# Patient Record
Sex: Male | Born: 1940 | Race: Black or African American | Hispanic: No | Smoking: Former smoker
Health system: Southern US, Community
[De-identification: ages and names within clinical notes are randomized; demographics above are authoritative.]

## PROBLEM LIST (undated history)

## (undated) DIAGNOSIS — N319 Neuromuscular dysfunction of bladder, unspecified: Secondary | ICD-10-CM

## (undated) DIAGNOSIS — N183 Chronic kidney disease, stage 3 unspecified: Secondary | ICD-10-CM

## (undated) DIAGNOSIS — J449 Chronic obstructive pulmonary disease, unspecified: Secondary | ICD-10-CM

## (undated) DIAGNOSIS — K219 Gastro-esophageal reflux disease without esophagitis: Secondary | ICD-10-CM

## (undated) DIAGNOSIS — J4489 Other specified chronic obstructive pulmonary disease: Secondary | ICD-10-CM

## (undated) DIAGNOSIS — K5730 Diverticulosis of large intestine without perforation or abscess without bleeding: Secondary | ICD-10-CM

## (undated) DIAGNOSIS — I10 Essential (primary) hypertension: Secondary | ICD-10-CM

## (undated) DIAGNOSIS — N179 Acute kidney failure, unspecified: Secondary | ICD-10-CM

## (undated) DIAGNOSIS — G8222 Paraplegia, incomplete: Secondary | ICD-10-CM

## (undated) DIAGNOSIS — F4310 Post-traumatic stress disorder, unspecified: Secondary | ICD-10-CM

## (undated) DIAGNOSIS — Z86711 Personal history of pulmonary embolism: Secondary | ICD-10-CM

## (undated) DIAGNOSIS — Z791 Long term (current) use of non-steroidal anti-inflammatories (NSAID): Secondary | ICD-10-CM

## (undated) DIAGNOSIS — I2510 Atherosclerotic heart disease of native coronary artery without angina pectoris: Secondary | ICD-10-CM

## (undated) DIAGNOSIS — J309 Allergic rhinitis, unspecified: Secondary | ICD-10-CM

## (undated) DIAGNOSIS — E7800 Pure hypercholesterolemia, unspecified: Secondary | ICD-10-CM

## (undated) DIAGNOSIS — D353 Benign neoplasm of craniopharyngeal duct: Secondary | ICD-10-CM

## (undated) DIAGNOSIS — M6281 Muscle weakness (generalized): Secondary | ICD-10-CM

## (undated) DIAGNOSIS — N186 End stage renal disease: Secondary | ICD-10-CM

## (undated) DIAGNOSIS — N400 Enlarged prostate without lower urinary tract symptoms: Secondary | ICD-10-CM

## (undated) DIAGNOSIS — Z8673 Personal history of transient ischemic attack (TIA), and cerebral infarction without residual deficits: Secondary | ICD-10-CM

## (undated) DIAGNOSIS — M5489 Other dorsalgia: Secondary | ICD-10-CM

## (undated) DIAGNOSIS — M109 Gout, unspecified: Secondary | ICD-10-CM

## (undated) DIAGNOSIS — E1122 Type 2 diabetes mellitus with diabetic chronic kidney disease: Secondary | ICD-10-CM

## (undated) DIAGNOSIS — F332 Major depressive disorder, recurrent severe without psychotic features: Secondary | ICD-10-CM

## (undated) DIAGNOSIS — E785 Hyperlipidemia, unspecified: Secondary | ICD-10-CM

## (undated) DIAGNOSIS — D352 Benign neoplasm of pituitary gland: Secondary | ICD-10-CM

## (undated) DIAGNOSIS — D509 Iron deficiency anemia, unspecified: Secondary | ICD-10-CM

## (undated) DIAGNOSIS — K5900 Constipation, unspecified: Secondary | ICD-10-CM

---

## 2016-07-14 ENCOUNTER — Emergency Department: Payer: Non-veteran care

## 2016-07-14 ENCOUNTER — Encounter: Payer: Self-pay | Admitting: Emergency Medicine

## 2016-07-14 ENCOUNTER — Inpatient Hospital Stay
Admission: EM | Admit: 2016-07-14 | Discharge: 2016-07-20 | DRG: 698 | Disposition: A | Discharge status: Skilled Nursing Facility | Payer: Non-veteran care | Attending: Internal Medicine | Admitting: Internal Medicine

## 2016-07-14 DIAGNOSIS — Z7901 Long term (current) use of anticoagulants: Secondary | ICD-10-CM | POA: Diagnosis not present

## 2016-07-14 DIAGNOSIS — N138 Other obstructive and reflux uropathy: Secondary | ICD-10-CM

## 2016-07-14 DIAGNOSIS — K219 Gastro-esophageal reflux disease without esophagitis: Secondary | ICD-10-CM | POA: Diagnosis present

## 2016-07-14 DIAGNOSIS — N186 End stage renal disease: Secondary | ICD-10-CM | POA: Diagnosis present

## 2016-07-14 DIAGNOSIS — M109 Gout, unspecified: Secondary | ICD-10-CM | POA: Diagnosis present

## 2016-07-14 DIAGNOSIS — L89612 Pressure ulcer of right heel, stage 2: Secondary | ICD-10-CM | POA: Diagnosis present

## 2016-07-14 DIAGNOSIS — E785 Hyperlipidemia, unspecified: Secondary | ICD-10-CM | POA: Diagnosis present

## 2016-07-14 DIAGNOSIS — N401 Enlarged prostate with lower urinary tract symptoms: Secondary | ICD-10-CM

## 2016-07-14 DIAGNOSIS — R5381 Other malaise: Secondary | ICD-10-CM

## 2016-07-14 DIAGNOSIS — I509 Heart failure, unspecified: Secondary | ICD-10-CM | POA: Diagnosis present

## 2016-07-14 DIAGNOSIS — T83518A Infection and inflammatory reaction due to other urinary catheter, initial encounter: Principal | ICD-10-CM | POA: Diagnosis present

## 2016-07-14 DIAGNOSIS — Z7983 Long term (current) use of bisphosphonates: Secondary | ICD-10-CM

## 2016-07-14 DIAGNOSIS — Z79899 Other long term (current) drug therapy: Secondary | ICD-10-CM

## 2016-07-14 DIAGNOSIS — G8222 Paraplegia, incomplete: Secondary | ICD-10-CM | POA: Diagnosis present

## 2016-07-14 DIAGNOSIS — E109 Type 1 diabetes mellitus without complications: Secondary | ICD-10-CM | POA: Diagnosis present

## 2016-07-14 DIAGNOSIS — Z794 Long term (current) use of insulin: Secondary | ICD-10-CM | POA: Diagnosis not present

## 2016-07-14 DIAGNOSIS — A419 Sepsis, unspecified organism: Secondary | ICD-10-CM | POA: Diagnosis present

## 2016-07-14 DIAGNOSIS — Z86711 Personal history of pulmonary embolism: Secondary | ICD-10-CM | POA: Diagnosis present

## 2016-07-14 DIAGNOSIS — Z7951 Long term (current) use of inhaled steroids: Secondary | ICD-10-CM

## 2016-07-14 DIAGNOSIS — Z8673 Personal history of transient ischemic attack (TIA), and cerebral infarction without residual deficits: Secondary | ICD-10-CM | POA: Diagnosis not present

## 2016-07-14 DIAGNOSIS — I2510 Atherosclerotic heart disease of native coronary artery without angina pectoris: Secondary | ICD-10-CM | POA: Diagnosis present

## 2016-07-14 DIAGNOSIS — I959 Hypotension, unspecified: Secondary | ICD-10-CM | POA: Diagnosis present

## 2016-07-14 DIAGNOSIS — F4310 Post-traumatic stress disorder, unspecified: Secondary | ICD-10-CM | POA: Diagnosis present

## 2016-07-14 DIAGNOSIS — N390 Urinary tract infection, site not specified: Secondary | ICD-10-CM | POA: Diagnosis present

## 2016-07-14 DIAGNOSIS — B9620 Unspecified Escherichia coli [E. coli] as the cause of diseases classified elsewhere: Secondary | ICD-10-CM | POA: Diagnosis present

## 2016-07-14 DIAGNOSIS — Y848 Other medical procedures as the cause of abnormal reaction of the patient, or of later complication, without mention of misadventure at the time of the procedure: Secondary | ICD-10-CM | POA: Diagnosis present

## 2016-07-14 DIAGNOSIS — Z7401 Bed confinement status: Secondary | ICD-10-CM

## 2016-07-14 DIAGNOSIS — E7800 Pure hypercholesterolemia, unspecified: Secondary | ICD-10-CM | POA: Diagnosis present

## 2016-07-14 DIAGNOSIS — I132 Hypertensive heart and chronic kidney disease with heart failure and with stage 5 chronic kidney disease, or end stage renal disease: Secondary | ICD-10-CM | POA: Diagnosis present

## 2016-07-14 DIAGNOSIS — F332 Major depressive disorder, recurrent severe without psychotic features: Secondary | ICD-10-CM | POA: Diagnosis present

## 2016-07-14 DIAGNOSIS — J449 Chronic obstructive pulmonary disease, unspecified: Secondary | ICD-10-CM | POA: Diagnosis present

## 2016-07-14 DIAGNOSIS — Z888 Allergy status to other drugs, medicaments and biological substances status: Secondary | ICD-10-CM

## 2016-07-14 DIAGNOSIS — R7989 Other specified abnormal findings of blood chemistry: Secondary | ICD-10-CM

## 2016-07-14 DIAGNOSIS — L89152 Pressure ulcer of sacral region, stage 2: Secondary | ICD-10-CM | POA: Diagnosis present

## 2016-07-14 DIAGNOSIS — Z91013 Allergy to seafood: Secondary | ICD-10-CM

## 2016-07-14 DIAGNOSIS — R000 Tachycardia, unspecified: Secondary | ICD-10-CM

## 2016-07-14 DIAGNOSIS — L8990 Pressure ulcer of unspecified site, unspecified stage: Secondary | ICD-10-CM | POA: Insufficient documentation

## 2016-07-14 HISTORY — DX: Muscle weakness (generalized): M62.81

## 2016-07-14 HISTORY — DX: Long term (current) use of non-steroidal anti-inflammatories (nsaid): Z79.1

## 2016-07-14 HISTORY — DX: Post-traumatic stress disorder, unspecified: F43.10

## 2016-07-14 HISTORY — DX: Other dorsalgia: M54.89

## 2016-07-14 HISTORY — DX: Iron deficiency anemia, unspecified: D50.9

## 2016-07-14 HISTORY — DX: Allergic rhinitis, unspecified: J30.9

## 2016-07-14 HISTORY — DX: Constipation, unspecified: K59.00

## 2016-07-14 HISTORY — DX: Other specified chronic obstructive pulmonary disease: J44.89

## 2016-07-14 HISTORY — DX: Type 2 diabetes mellitus with diabetic chronic kidney disease: E11.22

## 2016-07-14 HISTORY — DX: Gastro-esophageal reflux disease without esophagitis: K21.9

## 2016-07-14 HISTORY — DX: Personal history of transient ischemic attack (TIA), and cerebral infarction without residual deficits: Z86.73

## 2016-07-14 HISTORY — DX: Chronic obstructive pulmonary disease, unspecified: J44.9

## 2016-07-14 HISTORY — DX: Pure hypercholesterolemia, unspecified: E78.00

## 2016-07-14 HISTORY — DX: End stage renal disease: N18.6

## 2016-07-14 HISTORY — DX: Benign prostatic hyperplasia without lower urinary tract symptoms: N40.0

## 2016-07-14 HISTORY — DX: Essential (primary) hypertension: I10

## 2016-07-14 HISTORY — DX: Hyperlipidemia, unspecified: E78.5

## 2016-07-14 HISTORY — DX: Benign neoplasm of craniopharyngeal duct: D35.3

## 2016-07-14 HISTORY — DX: Acute kidney failure, unspecified: N17.9

## 2016-07-14 HISTORY — DX: Gout, unspecified: M10.9

## 2016-07-14 HISTORY — DX: Atherosclerotic heart disease of native coronary artery without angina pectoris: I25.10

## 2016-07-14 HISTORY — DX: Benign neoplasm of craniopharyngeal duct: D35.2

## 2016-07-14 HISTORY — DX: Diverticulosis of large intestine without perforation or abscess without bleeding: K57.30

## 2016-07-14 HISTORY — DX: Neuromuscular dysfunction of bladder, unspecified: N31.9

## 2016-07-14 HISTORY — DX: Chronic kidney disease, stage 3 (moderate): N18.3

## 2016-07-14 HISTORY — DX: Chronic kidney disease, stage 3 unspecified: N18.30

## 2016-07-14 HISTORY — DX: Personal history of pulmonary embolism: Z86.711

## 2016-07-14 HISTORY — DX: Paraplegia, incomplete: G82.22

## 2016-07-14 HISTORY — DX: Major depressive disorder, recurrent severe without psychotic features: F33.2

## 2016-07-14 LAB — COMPREHENSIVE METABOLIC PANEL
ALT: 24 U/L (ref 17–63)
AST: 34 U/L (ref 15–41)
Albumin: 3.3 g/dL — ABNORMAL LOW (ref 3.5–5.0)
Alkaline Phosphatase: 132 U/L — ABNORMAL HIGH (ref 38–126)
Anion gap: 9 (ref 5–15)
BUN: 51 mg/dL — ABNORMAL HIGH (ref 6–20)
CHLORIDE: 99 mmol/L — AB (ref 101–111)
CO2: 28 mmol/L (ref 22–32)
CREATININE: 1.94 mg/dL — AB (ref 0.61–1.24)
Calcium: 9.2 mg/dL (ref 8.9–10.3)
GFR, EST AFRICAN AMERICAN: 37 mL/min — AB (ref >60)
GFR, EST NON AFRICAN AMERICAN: 32 mL/min — AB (ref >60)
Glucose, Bld: 176 mg/dL — ABNORMAL HIGH (ref 65–99)
POTASSIUM: 3.6 mmol/L (ref 3.5–5.1)
Sodium: 136 mmol/L (ref 135–145)
Total Bilirubin: 0.7 mg/dL (ref 0.3–1.2)
Total Protein: 7.5 g/dL (ref 6.5–8.1)

## 2016-07-14 LAB — BLOOD GAS, VENOUS
BICARBONATE: 29.8 mmol/L — AB (ref 20.0–28.0)
PATIENT TEMPERATURE: 37
PCO2 VEN: 54 mmHg (ref 44.0–60.0)
pH, Ven: 7.35 (ref 7.250–7.430)
pO2, Ven: <31 mmHg — CL (ref 32.0–45.0)

## 2016-07-14 LAB — CBC WITH DIFFERENTIAL/PLATELET
Basophils Absolute: 0.1 10*3/uL (ref 0–0.1)
Basophils Relative: 1 %
Eosinophils Absolute: 0.1 10*3/uL (ref 0–0.7)
Eosinophils Relative: 1 %
HEMATOCRIT: 39.3 % — AB (ref 40.0–52.0)
HEMOGLOBIN: 13.3 g/dL (ref 13.0–18.0)
LYMPHS ABS: 1.9 10*3/uL (ref 1.0–3.6)
LYMPHS PCT: 20 %
MCH: 31.5 pg (ref 26.0–34.0)
MCHC: 33.8 g/dL (ref 32.0–36.0)
MCV: 93.2 fL (ref 80.0–100.0)
Monocytes Absolute: 1 10*3/uL (ref 0.2–1.0)
Monocytes Relative: 10 %
NEUTROS PCT: 68 %
Neutro Abs: 6.7 10*3/uL — ABNORMAL HIGH (ref 1.4–6.5)
Platelets: 279 10*3/uL (ref 150–440)
RBC: 4.21 MIL/uL — AB (ref 4.40–5.90)
RDW: 18.1 % — ABNORMAL HIGH (ref 11.5–14.5)
WBC: 9.8 10*3/uL (ref 3.8–10.6)

## 2016-07-14 LAB — URINALYSIS, COMPLETE (UACMP) WITH MICROSCOPIC
BILIRUBIN URINE: NEGATIVE
Glucose, UA: NEGATIVE mg/dL
Ketones, ur: NEGATIVE mg/dL
NITRITE: NEGATIVE
PH: 5 (ref 5.0–8.0)
Protein, ur: 100 mg/dL — AB
SPECIFIC GRAVITY, URINE: 1.012 (ref 1.005–1.030)
Squamous Epithelial / LPF: NONE SEEN

## 2016-07-14 LAB — LACTIC ACID, PLASMA
Lactic Acid, Venous: 2.1 mmol/L (ref 0.5–1.9)
Lactic Acid, Venous: 1.8 mmol/L (ref 0.5–1.9)

## 2016-07-14 LAB — PROTIME-INR
INR: 1.92
Prothrombin Time: 22.2 seconds — ABNORMAL HIGH (ref 11.4–15.2)

## 2016-07-14 MED ORDER — SENNOSIDES-DOCUSATE SODIUM 8.6-50 MG PO TABS
1.0000 | ORAL_TABLET | Freq: Every day | ORAL | Status: DC
  Administered 2016-07-15 – 2016-07-19 (×5): 1 via ORAL
  Filled 2016-07-14 (×6): qty 1

## 2016-07-14 MED ORDER — PIPERACILLIN-TAZOBACTAM 3.375 G IVPB 30 MIN
3.3750 g | Freq: Three times a day (TID) | INTRAVENOUS | Status: DC
  Filled 2016-07-14 (×2): qty 50

## 2016-07-14 MED ORDER — SODIUM CHLORIDE 0.9 % IV BOLUS (SEPSIS)
1000.0000 mL | Freq: Once | INTRAVENOUS | Status: AC
  Administered 2016-07-14: 1000 mL via INTRAVENOUS

## 2016-07-14 MED ORDER — ACETAMINOPHEN 325 MG PO TABS: 650.0000 mg | ORAL_TABLET | Freq: Four times a day (QID) | ORAL | Status: DC | PRN

## 2016-07-14 MED ORDER — MOMETASONE FURO-FORMOTEROL FUM 200-5 MCG/ACT IN AERO
2.0000 | INHALATION_SPRAY | Freq: Two times a day (BID) | RESPIRATORY_TRACT | Status: DC
  Administered 2016-07-15 – 2016-07-20 (×11): 2 via RESPIRATORY_TRACT
  Filled 2016-07-14: qty 8.8

## 2016-07-14 MED ORDER — WARFARIN SODIUM 1 MG PO TABS: 2.0000 mg | ORAL_TABLET | ORAL | Status: DC

## 2016-07-14 MED ORDER — VANCOMYCIN HCL IN DEXTROSE 1-5 GM/200ML-% IV SOLN
1000.0000 mg | Freq: Once | INTRAVENOUS | Status: AC
  Administered 2016-07-14: 1000 mg via INTRAVENOUS
  Filled 2016-07-14: qty 200

## 2016-07-14 MED ORDER — INSULIN GLARGINE 100 UNIT/ML ~~LOC~~ SOLN
15.0000 [IU] | Freq: Every morning | SUBCUTANEOUS | Status: DC
  Administered 2016-07-15 – 2016-07-20 (×6): 15 [IU] via SUBCUTANEOUS
  Filled 2016-07-14 (×6): qty 0.15

## 2016-07-14 MED ORDER — INSULIN ASPART 100 UNIT/ML ~~LOC~~ SOLN: 0.0000 [IU] | Freq: Three times a day (TID) | SUBCUTANEOUS | Status: DC

## 2016-07-14 MED ORDER — TAMSULOSIN HCL 0.4 MG PO CAPS
0.4000 mg | ORAL_CAPSULE | Freq: Every day | ORAL | Status: DC
  Administered 2016-07-15 – 2016-07-17 (×3): 0.4 mg via ORAL
  Filled 2016-07-14 (×3): qty 1

## 2016-07-14 MED ORDER — WARFARIN SODIUM 1 MG PO TABS
4.0000 mg | ORAL_TABLET | ORAL | Status: DC
  Administered 2016-07-16: 18:00:00 4 mg via ORAL
  Filled 2016-07-14: qty 4

## 2016-07-14 MED ORDER — ACETAMINOPHEN 650 MG RE SUPP: 650.0000 mg | Freq: Four times a day (QID) | RECTAL | Status: DC | PRN

## 2016-07-14 MED ORDER — HEPARIN SODIUM (PORCINE) 5000 UNIT/ML IJ SOLN
5000.0000 [IU] | Freq: Three times a day (TID) | INTRAMUSCULAR | Status: DC
  Administered 2016-07-15 (×2): 5000 [IU] via SUBCUTANEOUS
  Filled 2016-07-14 (×2): qty 1

## 2016-07-14 MED ORDER — PIPERACILLIN-TAZOBACTAM 3.375 G IVPB 30 MIN
3.3750 g | Freq: Once | INTRAVENOUS | Status: AC
Start: 2016-07-14 — End: 2016-07-14
  Administered 2016-07-14: 3.375 g via INTRAVENOUS
  Filled 2016-07-14: qty 50

## 2016-07-14 MED ORDER — METOPROLOL SUCCINATE ER 50 MG PO TB24: 100.0000 mg | ORAL_TABLET | Freq: Every day | ORAL | Status: DC

## 2016-07-14 NOTE — ED Provider Notes (Signed)
[facility] Emergency Department Provider Note    ____________________________________________   I have reviewed the triage vital signs and the nursing notes.   HISTORY  Chief Complaint Code Sepsis; Altered Mental Status; Hypotension; and Tachycardia   History limited by: Not Limited   HPI Carlos Calderon is a 76 y.o. male who presents to the emergency department today from living facility because of concerns for low blood pressure and decreased level of consciousness. Patient states he's been feeling unwell since yesterday. States it started certainly after he ate. It has continued today. He has had some associated shortness of breath and cough. He denies any associated chest or abdominal pain.    No past medical history on file.  There are no active problems to display for this patient.   No past surgical history on file.  Prior to Admission medications   Not on File    Allergies Patient has no allergy information on record.  No family history on file.  Social History Social History  Substance Use Topics  . Smoking status: Not on file  . Smokeless tobacco: Not on file  . Alcohol use Not on file  Lives at [facility]  Constitutional: Negative for fever. Cardiovascular: Negative for chest pain. Respiratory: Positive for shortness of breath. Gastrointestinal: Negative for abdominal pain, vomiting and diarrhea. Genitourinary: Negative for dysuria. Musculoskeletal: Negative for back pain. Skin: Negative for rash. Neurological: Negative for headaches, focal weakness or numbness.  10-point ROS otherwise negative.  ____________________________________________   PHYSICAL EXAM:  VITAL SIGNS: ED Triage Vitals  Enc Vitals Group     BP 07/14/16 1800 91/79     Pulse Rate 07/14/16 1800 (!) 122     Resp 07/14/16 1800 (!) 27     Temp 07/14/16 1800 97.3 F (36.3 C)     Temp Source 07/14/16 1800 Oral     SpO2 07/14/16  1800 93 %     Weight 07/14/16 1801 274 lb 11.2 oz (124.6 kg)   Constitutional: Alert and oriented. Well appearing and in no distress. Eyes: Conjunctivae are normal. Normal extraocular movements. ENT   Head: Normocephalic and atraumatic.   Nose: No congestion/rhinnorhea.   Mouth/Throat: Mucous membranes are moist.   Neck: No stridor. Hematological/Lymphatic/Immunilogical: No cervical lymphadenopathy. Cardiovascular:Tachycardic, regular rhythm.  No murmurs, rubs, or gallops. Respiratory: Tachypnea. Breath sounds are clear and equal bilaterally. No wheezes/rales/rhonchi. Gastrointestinal: Soft and non tender. No rebound. No guarding.  Genitourinary: Deferred Musculoskeletal: Normal range of motion in all extremities. No lower extremity edema. Neurologic:  Normal speech and language. No gross focal neurologic deficits are appreciated.  Skin:  Skin is warm, dry and intact. No rash noted. Psychiatric: Mood and affect are normal. Speech and behavior are normal. Patient exhibits appropriate insight and judgment.  ____________________________________________    LABS (pertinent positives/negatives)  Labs Reviewed  COMPREHENSIVE METABOLIC PANEL - Abnormal; Notable for the following:       Result Value   Chloride 99 (*)    Glucose, Bld 176 (*)    BUN 51 (*)    Creatinine, Ser 1.94 (*)    Albumin 3.3 (*)    Alkaline Phosphatase 132 (*)    GFR calc non Af Amer 32 (*)    GFR calc Af Amer 37 (*)    All other components within normal limits  LACTIC ACID, PLASMA - Abnormal; Notable for the following:    Lactic Acid, Venous 2.1 (*)    All other components within normal  limits  CBC WITH DIFFERENTIAL/PLATELET - Abnormal; Notable for the following:    RBC 4.21 (*)    HCT 39.3 (*)    RDW 18.1 (*)    Neutro Abs 6.7 (*)    All other components within normal limits  PROTIME-INR - Abnormal; Notable for the following:    Prothrombin Time 22.2 (*)    All other components within normal  limits  URINALYSIS, COMPLETE (UACMP) WITH MICROSCOPIC - Abnormal; Notable for the following:    Color, Urine YELLOW (*)    APPearance CLOUDY (*)    Hgb urine dipstick MODERATE (*)    Protein, ur 100 (*)    Leukocytes, UA LARGE (*)    Bacteria, UA MANY (*)    All other components within normal limits  BLOOD GAS, VENOUS - Abnormal; Notable for the following:    pO2, Ven <31.0 (*)    Bicarbonate 29.8 (*)    All other components within normal limits  CULTURE, BLOOD (ROUTINE X 2)  CULTURE, BLOOD (ROUTINE X 2)  URINE CULTURE  LACTIC ACID, PLASMA     ____________________________________________   EKG  I, Nance Pear, attending physician, personally viewed and interpreted this EKG  EKG Time: 1812 Rate: 120 Rhythm: sinus tachycardia Axis: left axis deviation Intervals: qtc 515 QRS: RBBB, LAFB ST changes: no st elevation Impression: abnormal ekg   ____________________________________________    RADIOLOGY  CXR IMPRESSION:  Mild cardiomegaly with possible mild vascular congestion. No focal  consolidation.    ____________________________________________   PROCEDURES  Procedures  CRITICAL CARE Performed by: Nance Pear   Total critical care time: 35 minutes  Critical care time was exclusive of separately billable procedures and treating other patients.  Critical care was necessary to treat or prevent imminent or life-threatening deterioration.  Critical care was time spent personally by me on the following activities: development of treatment plan with patient and/or surrogate as well as nursing, discussions with consultants, evaluation of patient's response to treatment, examination of patient, obtaining history from patient or surrogate, ordering and performing treatments and interventions, ordering and review of laboratory studies, ordering and review of radiographic studies, pulse oximetry and re-evaluation of patient's  condition.  ____________________________________________   INITIAL IMPRESSION / ASSESSMENT AND PLAN / ED COURSE  Pertinent labs & imaging results that were available during my care of the patient were reviewed by me and considered in my medical decision making (see chart for details).  Patient presented to the emergency department today because of concerns for feeling unwell. States symptoms started yesterday. On exam patient is awake and alert. He was significantly tachycardic. Additionally patient was tachypneic. Given these concerns patient was called a code sepsis. He was given multiple broad-spectrum IV antibiotics. Urine is likely the source at this time. Patient's chest x-ray without any obvious pneumonia. No leukocytosis although patient did have mild elevation of the lactic acid. Patient will be admitted to the hospitalist service.  ____________________________________________   FINAL CLINICAL IMPRESSION(S) / ED DIAGNOSES  Final diagnoses:  Elevated lactic acid level  Malaise  Tachycardia     Note: This dictation was prepared with Dragon dictation. Any transcriptional errors that result from this process are unintentional     Nance Pear, MD 07/14/16 2009

## 2016-07-14 NOTE — ED Notes (Signed)
Pt eating meal tray 

## 2016-07-14 NOTE — ED Notes (Signed)
Pt BP noted to be 70/40. Pt placed in mild trendelenburg at this time. MD notified, pt's BP 91/48.

## 2016-07-14 NOTE — H&P (Addendum)
PCP:   Alvester Morin, MD   Chief Complaint:  Not feeling well  HPI: This is a 76y/o male who resides at the nursing home who states he has not been feeling well the past 2 days. He reports nausea, diarrhea and upset stomach. He denies vomiting but states he did retch. He denies any BOU but he has a foley in place. He reports chills,and fevers. He denies any altered mentation. He denies any evidence of blood in his stool or urine.   Review of Systems:  The patient denies anorexia, fever, weight loss,, vision loss, decreased hearing, hoarseness, chest pain, syncope, dyspnea on exertion, peripheral edema, balance deficits, hemoptysis, nausea and vomiting, abdominal pain, melena, hematochezia, severe indigestion/heartburn, hematuria, incontinence, genital sores, muscle weakness, suspicious skin lesions, transient blindness, difficulty walking, depression, unusual weight change, abnormal bleeding, enlarged lymph nodes, angioedema, and breast masses.  Past Medical History: Past Medical History:  Diagnosis Date  . Acute renal failure syndrome ([place])   . Atopic rhinitis   . Benign enlargement of prostate   . 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 Chronic kidney disease, stage III (moderate)   . CN (constipation)   . Coronary atherosclerosis of native coronary artery   . Diverticulosis of large intestine without diverticulitis   . Encounter for long-term (current) use of non-steroidal anti-inflammatories   . Esophageal reflux   . Essential hypertension, malignant   . Gout, joint   . Hyperlipemia   . Incomplete paraplegia ([place])   . Left paraspinal back pain   . Muscle weakness (generalized)   . Neurogenic dysfunction of the urinary bladder   . Normocytic hypochromic anemia   . Obstructive chronic bronchitis without exacerbation ([place])   . Personal history of PE (pulmonary embolism)   . Personal history of transient cerebral ischemia   .  Posttraumatic stress disorder   . Pure hypercholesterolemia   . Severe recurrent major depression without psychotic features ([place])   . 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 No pertinent surgical history.  Medications: Prior to Admission medications   Medication Sig Start Date End Date Taking? Authorizing Provider  acetaminophen (TYLENOL) 325 MG tablet Take 650 mg by mouth every 4 (four) hours as needed.   Yes Historical Provider, MD  atorvastatin (LIPITOR) 40 MG tablet Take 40 mg by mouth at bedtime.   Yes Historical Provider, MD  budesonide-formoterol (SYMBICORT) 160-4.5 MCG/ACT inhaler Inhale 2 puffs into the lungs 2 (two) times daily.   Yes Historical Provider, MD  Cranberry 450 MG CAPS Take 1 capsule by mouth daily.   Yes Historical Provider, MD  insulin glargine (LANTUS) 100 UNIT/ML injection Inject 15 Units into the skin every morning.   Yes Historical Provider, MD  metoprolol succinate (TOPROL-XL) 100 MG 24 hr tablet Take 100 mg by mouth daily. Take with or immediately following a meal.   Yes Historical Provider, MD  Multiple Vitamin (MULTIVITAMIN) tablet Take 1 tablet by mouth daily.   Yes Historical Provider, MD  nicotine (NICODERM CQ - DOSED IN MG/24 HOURS) 14 mg/24hr patch Place 14 mg onto the skin daily.   Yes Historical Provider, MD  sennosides-docusate sodium (SENOKOT-S) 8.6-50 MG tablet Take 1 tablet by mouth daily.   Yes Historical Provider, MD  tamsulosin (FLOMAX) 0.4 MG CAPS capsule Take 0.4 mg by mouth daily.   Yes Historical Provider, MD  torsemide (DEMADEX) 20 MG tablet Take 40 mg by mouth daily.  Yes Historical Provider, MD  umeclidinium bromide (INCRUSE ELLIPTA) 62.5 MCG/INH AEPB Inhale 1 puff into the lungs daily.   Yes Historical Provider, MD  urea (CARMOL) 20 % cream Apply 1 application topically 2 (two) times daily.   Yes Historical Provider, MD  warfarin (COUMADIN) 2 MG tablet Take 2 mg by mouth See admin instructions.  Tuesday, Thursday, Saturday, and Sunday   Yes Historical Provider, MD  warfarin (COUMADIN) 4 MG tablet Take 4 mg by mouth See admin instructions. Monday, Wednesday, and Friday   Yes Historical Provider, MD    Allergies:   Allergies  Allergen Reactions  . Allopurinol   . Hctz [Hydrochlorothiazide]   . Oysters [employer] Allergy]     Social History:  negative for tobacco, alcohol or illicit drugs  Family History: History reviewed. No pertinent family history.  Physical Exam: Vitals:   07/14/16 1921 07/14/16 2030 07/14/16 2046 07/14/16 2140  BP: (!) 86/69 107/84 114/86 112/87  Pulse: (!) 115   (!) 108  Resp: 19 (!) 23 (!) 23 18  Temp:      TempSrc:      SpO2: 100%  100% 100%  Weight:        General:  Alert and oriented times three, well developed and nourished, no acute distress Eyes: PERRLA, pink conjunctiva, no scleral icterus ENT: dry oral mucosa, neck supple, no thyromegaly Lungs: clear to ascultation, no wheeze, no crackles, no use of accessory muscles Cardiovascular: regular rate and rhythm, no regurgitation, no gallops, no murmurs. No carotid bruits, no JVD Abdomen: soft, positive BS, non-tender, non-distended, no organomegaly, not an acute abdomen GU: chronic indwelling foley Neuro: CN II - XII grossly intact, sensation intact Musculoskeletal: strength 1/5 B/L LE,  Skin: thick black skin on B/L LE chronic,  Psych: appropriate patient   Labs on Admission:   Recent Labs  07/14/16 1810  NA 136  K 3.6  CL 99*  CO2 28  GLUCOSE 176*  BUN 51*  CREATININE 1.94*  CALCIUM 9.2    Recent Labs  07/14/16 1810  AST 34  ALT 24  ALKPHOS 132*  BILITOT 0.7  PROT 7.5  ALBUMIN 3.3*   No results for input(s): LIPASE, AMYLASE in the last 72 hours.  Recent Labs  07/14/16 1810  WBC 9.8  NEUTROABS 6.7*  HGB 13.3  HCT 39.3*  MCV 93.2  PLT 279   No results for input(s): CKTOTAL, CKMB, CKMBINDEX, TROPONINI in the last 72 hours. Invalid input(s): POCBNP No  results for input(s): DDIMER in the last 72 hours. No results for input(s): HGBA1C in the last 72 hours. No results for input(s): CHOL, HDL, LDLCALC, TRIG, CHOLHDL, LDLDIRECT in the last 72 hours. No results for input(s): TSH, T4TOTAL, T3FREE, THYROIDAB in the last 72 hours.  Invalid input(s): FREET3 No results for input(s): VITAMINB12, FOLATE, FERRITIN, TIBC, IRON, RETICCTPCT in the last 72 hours.  Micro Results: No results found for this or any previous visit (from the past 240 hour(s)).   Radiological Exams on Admission: Dg Chest Portable 1 View  Result Date: 07/14/2016 CLINICAL DATA:  76 year old male with sepsis EXAM: PORTABLE CHEST 1 VIEW COMPARISON:  None. FINDINGS: There is a shallow inspiration with bibasilar atelectatic changes, left greater right. There is no focal consolidation, pleural effusion, or pneumothorax. There is mild cardiomegaly with mild central vascular prominence which may either mild vascular congestion. No acute osseous pathology identified. IMPRESSION: Mild cardiomegaly with possible mild vascular congestion. No focal consolidation. Electronically Signed   By: Anner Crete  M.D.   On: 07/14/2016 19:01    Assessment/Plan Present on Admission: . Sepsis secondary to UTI [facility]) -admit to medsurg -blood and urine cultures collected -IV antibiotics vanco and zosyn ordered  . Hypotension -likely secondary top above -gentle IVF as patient with some fluid overload  chf -echo in AM. No prior studies  . Type 2 diabetes mellitus without complication (HCC) -ADa diet, SSI -home meds resumed  . chronic anticoaggulation h/o PE -stable, home meds reasumed -pharmacy to monitor   BPH -patient unable to confirm if he has BPH but patient with chronic foley. Foley changed in ER  Paraplegia h/o CVA -aware, home meds resumed  Chronic kidney disease -baseline unknown, no prior labs, IVF hydration undereway. Repeat BMP in AM    Larayne Baxley 07/14/2016, 9:47  PM

## 2016-07-14 NOTE — Progress Notes (Signed)
Delay in reporting critical venous po2.Spoke with Dr Archie Balboa @ [phone]

## 2016-07-14 NOTE — ED Triage Notes (Signed)
Patient from [facility]. Family requested patient be sent to [facility] for evaluation of Hypotension. Patient arrives, altered, hypotensive, tachycardic, tachypnec

## 2016-07-14 NOTE — ED Notes (Signed)
Pt status set to admit for the last hour, called ED sec to check on status

## 2016-07-15 ENCOUNTER — Inpatient Hospital Stay
Admit: 2016-07-15 | Discharge: 2016-07-15 | Disposition: A | Discharge status: Home / Self Care | Payer: Non-veteran care | Attending: Family Medicine | Admitting: Family Medicine

## 2016-07-15 LAB — ECHOCARDIOGRAM COMPLETE
HEIGHTINCHES: 76.8 in
Weight: 4281.6 oz

## 2016-07-15 LAB — BLOOD CULTURE ID PANEL (REFLEXED)
ACINETOBACTER BAUMANNII: NOT DETECTED
CANDIDA KRUSEI: NOT DETECTED
CANDIDA PARAPSILOSIS: NOT DETECTED
Candida albicans: NOT DETECTED
Candida glabrata: NOT DETECTED
Candida tropicalis: NOT DETECTED
ESCHERICHIA COLI: NOT DETECTED
Enterobacter cloacae complex: NOT DETECTED
Enterobacteriaceae species: NOT DETECTED
Enterococcus species: NOT DETECTED
HAEMOPHILUS INFLUENZAE: NOT DETECTED
KLEBSIELLA OXYTOCA: NOT DETECTED
Klebsiella pneumoniae: NOT DETECTED
LISTERIA MONOCYTOGENES: NOT DETECTED
METHICILLIN RESISTANCE: DETECTED — AB
Neisseria meningitidis: NOT DETECTED
PROTEUS SPECIES: NOT DETECTED
Pseudomonas aeruginosa: NOT DETECTED
SERRATIA MARCESCENS: NOT DETECTED
STAPHYLOCOCCUS AUREUS BCID: NOT DETECTED
STAPHYLOCOCCUS SPECIES: DETECTED — AB
STREPTOCOCCUS PNEUMONIAE: NOT DETECTED
Streptococcus agalactiae: NOT DETECTED
Streptococcus pyogenes: NOT DETECTED
Streptococcus species: NOT DETECTED

## 2016-07-15 LAB — BASIC METABOLIC PANEL
Anion gap: 7 (ref 5–15)
BUN: 50 mg/dL — AB (ref 6–20)
CHLORIDE: 104 mmol/L (ref 101–111)
CO2: 27 mmol/L (ref 22–32)
CREATININE: 1.84 mg/dL — AB (ref 0.61–1.24)
Calcium: 8.7 mg/dL — ABNORMAL LOW (ref 8.9–10.3)
GFR calc Af Amer: 40 mL/min — ABNORMAL LOW (ref >60)
GFR calc non Af Amer: 34 mL/min — ABNORMAL LOW (ref >60)
Glucose, Bld: 150 mg/dL — ABNORMAL HIGH (ref 65–99)
Potassium: 3.6 mmol/L (ref 3.5–5.1)
SODIUM: 138 mmol/L (ref 135–145)

## 2016-07-15 LAB — CREATININE, SERUM
Creatinine, Ser: 1.94 mg/dL — ABNORMAL HIGH (ref 0.61–1.24)
GFR calc Af Amer: 37 mL/min — ABNORMAL LOW (ref >60)
GFR calc non Af Amer: 32 mL/min — ABNORMAL LOW (ref >60)

## 2016-07-15 LAB — CBC
HCT: 34.4 % — ABNORMAL LOW (ref 40.0–52.0)
Hemoglobin: 11.6 g/dL — ABNORMAL LOW (ref 13.0–18.0)
MCH: 31.5 pg (ref 26.0–34.0)
MCHC: 33.7 g/dL (ref 32.0–36.0)
MCV: 93.3 fL (ref 80.0–100.0)
PLATELETS: 216 10*3/uL (ref 150–440)
RBC: 3.69 MIL/uL — ABNORMAL LOW (ref 4.40–5.90)
RDW: 18.2 % — ABNORMAL HIGH (ref 11.5–14.5)
WBC: 7.6 10*3/uL (ref 3.8–10.6)

## 2016-07-15 LAB — VANCOMYCIN, RANDOM: Vancomycin Rm: 23

## 2016-07-15 LAB — MRSA PCR SCREENING: MRSA BY PCR: NEGATIVE

## 2016-07-15 LAB — GLUCOSE, CAPILLARY
GLUCOSE-CAPILLARY: 111 mg/dL — AB (ref 65–99)
Glucose-Capillary: 105 mg/dL — ABNORMAL HIGH (ref 65–99)
Glucose-Capillary: 120 mg/dL — ABNORMAL HIGH (ref 65–99)
Glucose-Capillary: 153 mg/dL — ABNORMAL HIGH (ref 65–99)
Glucose-Capillary: 201 mg/dL — ABNORMAL HIGH (ref 65–99)

## 2016-07-15 LAB — PROTIME-INR
INR: 2.14
PROTHROMBIN TIME: 24.3 s — AB (ref 11.4–15.2)

## 2016-07-15 MED ORDER — HYDROCERIN EX CREA
TOPICAL_CREAM | Freq: Two times a day (BID) | CUTANEOUS | Status: DC
  Administered 2016-07-15 – 2016-07-20 (×8): via TOPICAL
  Filled 2016-07-15: qty 113

## 2016-07-15 MED ORDER — VANCOMYCIN HCL 10 G IV SOLR
1500.0000 mg | INTRAVENOUS | Status: DC
  Administered 2016-07-15: 1500 mg via INTRAVENOUS
  Filled 2016-07-15 (×2): qty 1500

## 2016-07-15 MED ORDER — INSULIN ASPART 100 UNIT/ML ~~LOC~~ SOLN
0.0000 [IU] | Freq: Three times a day (TID) | SUBCUTANEOUS | Status: DC
  Administered 2016-07-15 – 2016-07-17 (×5): 2 [IU] via SUBCUTANEOUS
  Administered 2016-07-18 – 2016-07-20 (×4): 1 [IU] via SUBCUTANEOUS
  Filled 2016-07-15: qty 1
  Filled 2016-07-15 (×2): qty 2
  Filled 2016-07-15 (×2): qty 1
  Filled 2016-07-15 (×3): qty 2
  Filled 2016-07-15: qty 1

## 2016-07-15 MED ORDER — WARFARIN - PHYSICIAN DOSING INPATIENT: Freq: Every day | Status: DC

## 2016-07-15 MED ORDER — WARFARIN - PHARMACIST DOSING INPATIENT
Freq: Every day | Status: DC
  Administered 2016-07-15 – 2016-07-18 (×3)
  Administered 2016-07-19: 18:00:00 4

## 2016-07-15 MED ORDER — VANCOMYCIN HCL IN DEXTROSE 1-5 GM/200ML-% IV SOLN
1000.0000 mg | Freq: Once | INTRAVENOUS | Status: AC
  Administered 2016-07-15: 02:00:00 1000 mg via INTRAVENOUS
  Filled 2016-07-15: qty 200

## 2016-07-15 MED ORDER — WARFARIN SODIUM 1 MG PO TABS
2.0000 mg | ORAL_TABLET | ORAL | Status: DC
  Administered 2016-07-15: 18:00:00 2 mg via ORAL
  Filled 2016-07-15: qty 2

## 2016-07-15 MED ORDER — INSULIN ASPART 100 UNIT/ML ~~LOC~~ SOLN: 0.0000 [IU] | Freq: Every day | SUBCUTANEOUS | Status: DC

## 2016-07-15 MED ORDER — PIPERACILLIN-TAZOBACTAM 3.375 G IVPB
3.3750 g | Freq: Three times a day (TID) | INTRAVENOUS | Status: DC
  Administered 2016-07-15 – 2016-07-18 (×10): 3.375 g via INTRAVENOUS
  Filled 2016-07-15 (×10): qty 50

## 2016-07-15 NOTE — Progress Notes (Signed)
ANTICOAGULATION CONSULT NOTE - Initial Consult  Pharmacy Consult for warfarin dosing Indication: h/o pulmonary embolus  Allergies  Allergen Reactions  . Allopurinol   . Hctz [Hydrochlorothiazide]   . Oysters [facility] Allergy]     Patient Measurements: Height: 6' 4.8" (195.1 cm) Weight: 267 lb 9.6 oz (121.4 kg) IBW/kg (Calculated) : 88.64 Heparin Dosing Weight: 114 kg   Vital Signs: Temp: 97.8 F (36.6 C) (03/18 0019) Temp Source: Oral (03/18 0019) BP: 98/64 (03/18 0019) Pulse Rate: 108 (03/18 0019)  Labs:  Recent Labs  07/14/16 1810  HGB 13.3  HCT 39.3*  PLT 279  LABPROT 22.2*  INR 1.92  CREATININE 1.94*    Estimated Creatinine Clearance: 48.1 mL/min (A) (by C-G formula based on SCr of 1.94 mg/dL (H)).   Medical History: Past Medical History:  Diagnosis Date  . Acute renal failure syndrome ([place])   . Atopic rhinitis   . Benign enlargement of prostate   . 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 Chronic kidney disease, stage III (moderate)   . CN (constipation)   . Coronary atherosclerosis of native coronary artery   . Diverticulosis of large intestine without diverticulitis   . Encounter for long-term (current) use of non-steroidal anti-inflammatories   . Esophageal reflux   . Essential hypertension, malignant   . Gout, joint   . Hyperlipemia   . Incomplete paraplegia ([place])   . Left paraspinal back pain   . Muscle weakness (generalized)   . Neurogenic dysfunction of the urinary bladder   . Normocytic hypochromic anemia   . Obstructive chronic bronchitis without exacerbation ([place])   . Personal history of PE (pulmonary embolism)   . Personal history of transient cerebral ischemia   . Posttraumatic stress disorder   . Pure hypercholesterolemia   . Severe recurrent major depression without psychotic features ([place])   . Type 2 diabetes mellitus with end-stage renal disease (HCC)     Medications:  Scheduled:  .  heparin  5,000 Units Subcutaneous Q8H  . insulin aspart  0-15 Units Subcutaneous TID WC  . insulin glargine  15 Units Subcutaneous q morning - 10a  . metoprolol succinate  100 mg Oral Daily  . mometasone-formoterol  2 puff Inhalation BID  . piperacillin-tazobactam  3.375 g Intravenous Q8H  . senna-docusate  1 tablet Oral Daily  . tamsulosin  0.4 mg Oral Daily  . vancomycin  1,000 mg Intravenous Once  . warfarin  2 mg Oral Q T,Th,S,Su-1800  . [START ON 07/16/2016] warfarin  4 mg Oral Q M,W,F-1800  . Warfarin - Physician Dosing Inpatient   Does not apply q1800    Assessment: Patient admitted for N/V and is anticoagulated w/ warfarin for h/o of PE. Patient takes warfarin: Warfarin 4 mg on MWF Warfarin 2 mg on TThSaSun  3/17 INR 1.92 Goal of Therapy:  INR 2-3 Monitor platelets by anticoagulation protocol: Yes   Plan:  Will continue home dose of warfarin 2 mg on TThSaSun and 4 mg on MWF. Will follow daily INRs while in-house and continue to monitor CBC  Thank you for this consult.  Tobie Lords, PharmD, BCPS Clinical Pharmacist 07/15/2016

## 2016-07-15 NOTE — Progress Notes (Signed)
A&O pt, disoriented to year. Denies pain at this time. Pt is from [facility]. Grand daughter is at bedside and states that she is HCPOA, copy requested to place in chart. Pt has a stage II to sacral area and a deep tissue injury to right heel, both pressure injuries covered with foam dressing. Pt also has cracked/thickened rough skin to BL extremities. Pt has a chronic foley catheter that was exchanged in the ED due to suspicion of infection source per report. Pt started on IV zosyn and IV vancomycin. Provided patient education concerning diagnosis, medications, call light and room orientation. Pt verbalized understanding.

## 2016-07-15 NOTE — Progress Notes (Signed)
Patient ID: Carlos Calderon, male   DOB: 30-Oct-1940, 76 y.o.   MRN: [id number]  [employer] [facility] PROGRESS NOTE  Carlos Calderon BJS:[id number] DOB: [birth year] DOA: 07/14/2016 PCP: Alvester Morin, MD  HPI/Subjective: Patient upset about the temperature in the room and that he missed breakfast. He physically feels okay. Brought in with catheter infection  Objective: Vitals:   07/15/16 1021 07/15/16 1453  BP: 104/66 119/69  Pulse: 79 87  Resp:    Temp:  97.6 F (36.4 C)    Filed Weights   07/14/16 1801 07/15/16 0000  Weight: 124.6 kg (274 lb 11.2 oz) 121.4 kg (267 lb 9.6 oz)    ROS: Review of Systems  Constitutional: Negative for chills and fever.  Eyes: Negative for blurred vision.  Respiratory: Negative for cough and shortness of breath.   Cardiovascular: Negative for chest pain.  Gastrointestinal: Negative for abdominal pain, constipation, diarrhea, nausea and vomiting.  Genitourinary: Negative for dysuria.  Musculoskeletal: Negative for joint pain.  Neurological: Negative for dizziness and headaches.   Exam: Physical Exam  Constitutional: He is oriented to person, place, and time.  HENT:  Nose: No mucosal edema.  Mouth/Throat: No oropharyngeal exudate or posterior oropharyngeal edema.  Eyes: Conjunctivae, EOM and lids are normal. Pupils are equal, round, and reactive to light.  Neck: No JVD present. Carotid bruit is not present. No edema present. No thyroid mass and no thyromegaly present.  Cardiovascular: S1 normal and S2 normal.  Exam reveals no gallop.   No murmur heard. Pulses:      Dorsalis pedis pulses are 2+ on the right side, and 2+ on the left side.  Respiratory: No respiratory distress. He has no wheezes. He has no rhonchi. He has no rales.  GI: Soft. Bowel sounds are normal. There is no tenderness.  Musculoskeletal:       Right knee: He exhibits swelling.       Left knee: He exhibits swelling.       Right ankle: He exhibits swelling.       Left ankle: He  exhibits swelling.  Lymphadenopathy:    He has no cervical adenopathy.  Neurological: He is alert and oriented to person, place, and time.  Skin: Skin is warm. Nails show no clubbing.  Right heel small stage II decubiti without signs of infection. As per nurse sacral decubiti stage II without signs of infection. Scaling and dryness bilateral lower extremities  Psychiatric: He has a normal mood and affect.      Data Reviewed: Basic Metabolic Panel:  Recent Labs Lab 07/14/16 1810 07/15/16 0024 07/15/16 0509  NA 136  --  138  K 3.6  --  3.6  CL 99*  --  104  CO2 28  --  27  GLUCOSE 176*  --  150*  BUN 51*  --  50*  CREATININE 1.94* 1.94* 1.84*  CALCIUM 9.2  --  8.7*   Liver Function Tests:  Recent Labs Lab 07/14/16 1810  AST 34  ALT 24  ALKPHOS 132*  BILITOT 0.7  PROT 7.5  ALBUMIN 3.3*   CBC:  Recent Labs Lab 07/14/16 1810 07/15/16 0509  WBC 9.8 7.6  NEUTROABS 6.7*  --   HGB 13.3 11.6*  HCT 39.3* 34.4*  MCV 93.2 93.3  PLT 279 216    CBG:  Recent Labs Lab 07/15/16 0013 07/15/16 0752 07/15/16 1147  GLUCAP 201* 111* 105*    Recent Results (from the past 240 hour(s))  Culture, blood (Routine x 2)  Status: None (Preliminary result)   Collection Time: 07/14/16  6:10 PM  Result Value Ref Range Status   Specimen Description BLOOD BLOOD LEFT HAND  Final   Special Requests   Final    BOTTLES DRAWN AEROBIC AND ANAEROBIC ADEQUATE VOLUME   Culture  Setup Time   Final    GRAM POSITIVE COCCI ANAEROBIC BOTTLE ONLY CRITICAL RESULT CALLED TO, READ BACK BY AND VERIFIED WITH: KAREN HAYES ON 07/15/16 AT 1022 [facility] Performed at [facility], [place] [address], [place], [place] [address]    Culture GRAM POSITIVE COCCI  Final   Report Status PENDING  Incomplete  Culture, blood (Routine x 2)     Status: None (Preliminary result)   Collection Time: 07/14/16  6:10 PM  Result Value Ref Range Status   Specimen Description BLOOD RIGHT ANTECUBITAL  Final   Special  Requests   Final    BOTTLES DRAWN AEROBIC AND ANAEROBIC ADEQUATE VOLUME   Culture NO GROWTH < 24 HOURS  Final   Report Status PENDING  Incomplete  Blood Culture ID Panel (Reflexed)     Status: Abnormal   Collection Time: 07/14/16  6:10 PM  Result Value Ref Range Status   Enterococcus species NOT DETECTED NOT DETECTED Final   Listeria monocytogenes NOT DETECTED NOT DETECTED Final   Staphylococcus species DETECTED (A) NOT DETECTED Final    Comment: Methicillin (oxacillin) resistant coagulase negative staphylococcus. Possible blood culture contaminant (unless isolated from more than one blood culture draw or clinical case suggests pathogenicity). No antibiotic treatment is indicated for blood  culture contaminants. CRITICAL RESULT CALLED TO, READ BACK BY AND VERIFIED WITH: Charlane Ferretti @ [phone] 07/15/16 by [facility]    Staphylococcus aureus NOT DETECTED NOT DETECTED Final   Methicillin resistance DETECTED (A) NOT DETECTED Final    Comment: CRITICAL RESULT CALLED TO, READ BACK BY AND VERIFIED WITH: Charlane Ferretti @ [phone] 07/15/16 by [facility]    Streptococcus species NOT DETECTED NOT DETECTED Final   Streptococcus agalactiae NOT DETECTED NOT DETECTED Final   Streptococcus pneumoniae NOT DETECTED NOT DETECTED Final   Streptococcus pyogenes NOT DETECTED NOT DETECTED Final   Acinetobacter baumannii NOT DETECTED NOT DETECTED Final   Enterobacteriaceae species NOT DETECTED NOT DETECTED Final   Enterobacter cloacae complex NOT DETECTED NOT DETECTED Final   Escherichia coli NOT DETECTED NOT DETECTED Final   Klebsiella oxytoca NOT DETECTED NOT DETECTED Final   Klebsiella pneumoniae NOT DETECTED NOT DETECTED Final   Proteus species NOT DETECTED NOT DETECTED Final   Serratia marcescens NOT DETECTED NOT DETECTED Final   Haemophilus influenzae NOT DETECTED NOT DETECTED Final   Neisseria meningitidis NOT DETECTED NOT DETECTED Final   Pseudomonas aeruginosa NOT DETECTED NOT DETECTED Final   Candida albicans NOT DETECTED  NOT DETECTED Final   Candida glabrata NOT DETECTED NOT DETECTED Final   Candida krusei NOT DETECTED NOT DETECTED Final   Candida parapsilosis NOT DETECTED NOT DETECTED Final   Candida tropicalis NOT DETECTED NOT DETECTED Final  MRSA PCR Screening     Status: None   Collection Time: 07/15/16  1:40 AM  Result Value Ref Range Status   MRSA by PCR NEGATIVE NEGATIVE Final    Comment:        The GeneXpert MRSA Assay (FDA approved for NASAL specimens only), is one component of a comprehensive MRSA colonization surveillance program. It is not intended to diagnose MRSA infection nor to guide or monitor treatment for MRSA infections.      Studies: Dg Chest  Portable 1 View  Result Date: 07/14/2016 CLINICAL DATA:  76 year old male with sepsis EXAM: PORTABLE CHEST 1 VIEW COMPARISON:  None. FINDINGS: There is a shallow inspiration with bibasilar atelectatic changes, left greater right. There is no focal consolidation, pleural effusion, or pneumothorax. There is mild cardiomegaly with mild central vascular prominence which may either mild vascular congestion. No acute osseous pathology identified. IMPRESSION: Mild cardiomegaly with possible mild vascular congestion. No focal consolidation. Electronically Signed   By: Anner Crete M.D.   On: 07/14/2016 19:01    Scheduled Meds: . hydrocerin   Topical BID  . insulin aspart  0-15 Units Subcutaneous TID WC  . insulin glargine  15 Units Subcutaneous q morning - 10a  . metoprolol succinate  100 mg Oral Daily  . mometasone-formoterol  2 puff Inhalation BID  . piperacillin-tazobactam (ZOSYN)  IV  3.375 g Intravenous Q8H  . senna-docusate  1 tablet Oral Daily  . tamsulosin  0.4 mg Oral Daily  . vancomycin  1,500 mg Intravenous Q24H  . warfarin  2 mg Oral Q T,Th,S,Su-1800  . [START ON 07/16/2016] warfarin  4 mg Oral Q M,W,F-1800  . Warfarin - Pharmacist Dosing Inpatient   Does not apply q1800    Assessment/Plan:  1. Clinical sepsis documented  by admitting physician. Foley catheter changed in the ER. Patient placed on vancomycin and Zosyn. Initial blood culture positive in 1 bottle. This could be a contaminant. Awaiting urine culture. 2. Hypotension. This has improved. Hold off on further IV fluids. Hold metoprolol. 3. History of pulmonary embolism on Coumadin. Pharmacy dosing 4. Chronic  Foley catheter. With BPH on Flomax Recommend changing catheter every 3 weeks 5. Dryness of the lower extremity on Eucerin cream 6. Type 2 diabetes mellitus on Lantus and sliding scale 7. History of CVA and being bedbound On Coumadin 8. Stage II sacral decubiti. I did not visualize this today because he was eating at the time. But as per nurse this is not a sign of infection 9. Stage II right heel decubiti visualized by me. Looks like it's healing well. 10. Chronic kidney disease stage III. Continue to monitor  Code Status:     Code Status Orders        Start     Ordered   07/14/16 2352  Full code  Continuous     07/14/16 2351    Code Status History    Date Active Date Inactive Code Status Order ID Comments User Context   This patient has a current code status but no historical code status.    Advance Directive Documentation     Most Recent Value  Type of Advance Directive  Healthcare Power of Attorney  Pre-existing out of facility DNR order (yellow form or pink MOST form)  -  "MOST" Form in Place?  -     Disposition Plan: to be determined  Antibiotics:  Vancomycin  Zosyn  Time spent: 30 minutes  [place], [place]

## 2016-07-15 NOTE — Progress Notes (Signed)
Pharmacy Antibiotic Note  Carlos Calderon is a 76 y.o. male admitted on 07/14/2016 with sepsis.  Pharmacy has been consulted for vancomycin/zosyn dosing.  Plan: Patient received vanc 1g IV x 1  Will administer another vanc 1g IV x 1 for a total load of 2g for a 20 mg/kg load of adjusted body weight. Will draw a stat creatinine and a random vanc level to trend renal function and see if patient is clearing drug. Will initiate zosyn 3.375g IV q8h over 4 hours.  3/18 VR @ 0500: 23, Scr 1.94 - 1.84 renal function improving. Will initiate a scheduled regimen of vanc 1.5g (AdjBW = 95 kg) q24h starting 3/18 @ 1400 (which is when patient should be at a level of 15 mcg/mL). Will check a vanc trough 3/20 @ 1300 prior to 3rd dose to ensure patient is not accumulating.  Height: 6' 4.8" (195.1 cm) Weight: 267 lb 9.6 oz (121.4 kg) IBW/kg (Calculated) : 88.64  Temp (24hrs), Avg:97.6 F (36.4 C), Min:97.3 F (36.3 C), Max:97.8 F (36.6 C)   Last Labs    Recent Labs Lab 07/14/16 1810 07/14/16 2050  WBC 9.8  --   CREATININE 1.94*  --   LATICACIDVEN 2.1* 1.8      Estimated Creatinine Clearance: 48.1 mL/min (A) (by C-G formula based on SCr of 1.94 mg/dL (H)).        Allergies  Allergen Reactions  . Allopurinol   . Hctz [Hydrochlorothiazide]   . Oysters [Shellfish Allergy]     Antimicrobials this admission: 3/17 vanc >>  3/17 zosyn >>   Microbiology results: 3/17 BCx: sent 3/17 UCx: sent   Thank you for allowing pharmacy to be a part of this patient's care.  Tobie Lords, PharmD, BCPS Clinical Pharmacist 07/15/2016

## 2016-07-15 NOTE — Progress Notes (Signed)
Pharmacy Antibiotic Note  Carlos Calderon is a 76 y.o. male admitted on 07/14/2016 with sepsis.  Pharmacy has been consulted for vancomycin/zosyn dosing.  Plan: Patient received vanc 1g IV x 1  Will administer another vanc 1g IV x 1 for a total load of 2g for a 20 mg/kg load of adjusted body weight. Will draw a stat creatinine and a random vanc level to trend renal function and see if patient is clearing drug. Will initiate zosyn 3.375g IV q8h over 4 hours.  Height: 6' 4.8" (195.1 cm) Weight: 267 lb 9.6 oz (121.4 kg) IBW/kg (Calculated) : 88.64  Temp (24hrs), Avg:97.6 F (36.4 C), Min:97.3 F (36.3 C), Max:97.8 F (36.6 C)   Recent Labs Lab 07/14/16 1810 07/14/16 2050  WBC 9.8  --   CREATININE 1.94*  --   LATICACIDVEN 2.1* 1.8    Estimated Creatinine Clearance: 48.1 mL/min (A) (by C-G formula based on SCr of 1.94 mg/dL (H)).    Allergies  Allergen Reactions  . Allopurinol   . Hctz [Hydrochlorothiazide]   . Oysters [Shellfish Allergy]     Antimicrobials this admission: 3/17 vanc >>  3/17 zosyn >>   Microbiology results: 3/17 BCx: sent 3/17 UCx: sent   Thank you for allowing pharmacy to be a part of this patient's care.  Tobie Lords, PharmD, BCPS Clinical Pharmacist 07/15/2016

## 2016-07-15 NOTE — Consult Note (Signed)
[facility] Nurse wound consult note Reason for Consult: Right heel Unstageable pressure Injury, sacral Stage 2 pressure injury, chronic lymphedema bilaterally Wound type:Pressure, moisture Pressure Injury POA: Yes Measurement:Sacral measures 1.5cm x 1cm x 0.1cm Stage 2 with pink, moist wound bed, scant serous exudate. Right heel with 1cm x 2.5cm deflated blood blister (shear/pressure), unable to assess depth.  No drainage.  Bilateral lymphedema with chronic skin changes. Wound bed:As described above Drainage (amount, consistency, odor) As noted above Periwound:Intact Dressing procedure/placement/frequency: Patient from SNF with POA pressure injury to sacrum and right heel.  POC will include removal of adult briefs (he already has an indwelling urinary catheter), turning and repositioning to avoid the supine position, pressure redistribution heel boots, a pressure redistribution chair pad while OOB and a therapeutic mattress while in house that has a low air loss feature. Patient's LEs will be moisturized with Eucerin cream twice daily. He is in agreement with the POC. [facility] nursing team will not follow, but will remain available to this patient, the nursing and medical teams.  Please re-consult if needed. Thanks, Maudie Flakes, MSN, RN, [place], Arther Abbott  Pager# [phone]

## 2016-07-15 NOTE — Progress Notes (Signed)
ANTICOAGULATION CONSULT NOTE - Follow Up Consult  Pharmacy Consult for warfarin dosing Indication: h/o pulmonary embolus  Allergies  Allergen Reactions  . Allopurinol   . Hctz [Hydrochlorothiazide]   . Oysters [facility]]     Patient Measurements: Height: 6' 4.8" (195.1 cm) Weight: 267 lb 9.6 oz (121.4 kg) IBW/kg (Calculated) : 88.64 Heparin Dosing Weight:    Vital Signs: Temp: 97.8 F (36.6 C) (03/18 0446) Temp Source: Oral (03/18 0446) BP: 104/66 (03/18 1021) Pulse Rate: 79 (03/18 1021)  Labs:  Recent Labs  07/14/16 1810 07/15/16 0024 07/15/16 0509 07/15/16 0838  HGB 13.3  --  11.6*  --   HCT 39.3*  --  34.4*  --   PLT 279  --  216  --   LABPROT 22.2*  --   --  24.3*  INR 1.92  --   --  2.14  CREATININE 1.94* 1.94* 1.84*  --     Estimated Creatinine Clearance: 50.7 mL/min (A) (by C-G formula based on SCr of 1.84 mg/dL (H)).   Assessment: Patient admitted for N/V and is anticoagulated w/ warfarin for h/o of PE. Patient takes warfarin: Warfarin 4 mg on MWF Warfarin 2 mg on TThSaSun  3/17  INR 1.92 3/18  INR  2.14    Goal of Therapy:  INR 2-3 Monitor platelets by anticoagulation protocol: Yes   Plan:  Will continue home dose of warfarin 2 mg on TThSaSun and 4 mg on MWF. Will follow daily INRs while on antibiotics and continue to monitor CBC   Jenell Dobransky A 07/15/2016,10:29 AM

## 2016-07-15 NOTE — Progress Notes (Signed)
PHARMACY - PHYSICIAN COMMUNICATION CRITICAL VALUE ALERT - BLOOD CULTURE IDENTIFICATION (BCID)  1 Anerobic bottle with gram + cocci BCID:  Staphlococcus Species -mecA +  Currently on Vancomycin and Zosyn  Name of physician (or Provider) Contacted: Dr. Leslye Peer  Changes to prescribed antibiotics required: None at this time  Carson Bogden K 07/15/2016  10:41 AM

## 2016-07-16 DIAGNOSIS — L8990 Pressure ulcer of unspecified site, unspecified stage: Secondary | ICD-10-CM | POA: Insufficient documentation

## 2016-07-16 LAB — BASIC METABOLIC PANEL
Anion gap: 7 (ref 5–15)
BUN: 50 mg/dL — ABNORMAL HIGH (ref 6–20)
CALCIUM: 8.7 mg/dL — AB (ref 8.9–10.3)
CO2: 25 mmol/L (ref 22–32)
CREATININE: 1.86 mg/dL — AB (ref 0.61–1.24)
Chloride: 104 mmol/L (ref 101–111)
GFR, EST AFRICAN AMERICAN: 39 mL/min — AB (ref >60)
GFR, EST NON AFRICAN AMERICAN: 34 mL/min — AB (ref >60)
Glucose, Bld: 100 mg/dL — ABNORMAL HIGH (ref 65–99)
Potassium: 3.5 mmol/L (ref 3.5–5.1)
SODIUM: 136 mmol/L (ref 135–145)

## 2016-07-16 LAB — GLUCOSE, CAPILLARY
GLUCOSE-CAPILLARY: 164 mg/dL — AB (ref 65–99)
Glucose-Capillary: 84 mg/dL (ref 65–99)
Glucose-Capillary: 161 mg/dL — ABNORMAL HIGH (ref 65–99)
Glucose-Capillary: 161 mg/dL — ABNORMAL HIGH (ref 65–99)
Glucose-Capillary: 170 mg/dL — ABNORMAL HIGH (ref 65–99)

## 2016-07-16 LAB — PROTIME-INR
INR: 2.1
Prothrombin Time: 23.9 seconds — ABNORMAL HIGH (ref 11.4–15.2)

## 2016-07-16 MED ORDER — METOPROLOL TARTRATE 25 MG PO TABS
12.5000 mg | ORAL_TABLET | Freq: Two times a day (BID) | ORAL | Status: DC
  Administered 2016-07-16 – 2016-07-20 (×8): 12.5 mg via ORAL
  Filled 2016-07-16 (×7): qty 1
  Filled 2016-07-16: qty 0.5
  Filled 2016-07-16: qty 1

## 2016-07-16 MED ORDER — ENSURE ENLIVE PO LIQD
237.0000 mL | Freq: Two times a day (BID) | ORAL | Status: DC
  Administered 2016-07-17 – 2016-07-20 (×7): 237 mL via ORAL

## 2016-07-16 MED ORDER — ADULT MULTIVITAMIN W/MINERALS CH
1.0000 | ORAL_TABLET | Freq: Every day | ORAL | Status: DC
  Administered 2016-07-16 – 2016-07-20 (×5): 1 via ORAL
  Filled 2016-07-16 (×5): qty 1

## 2016-07-16 MED ORDER — GI COCKTAIL ~~LOC~~
30.0000 mL | Freq: Once | ORAL | Status: AC
  Administered 2016-07-16: 30 mL via ORAL
  Filled 2016-07-16: qty 30

## 2016-07-16 MED ORDER — SODIUM CHLORIDE 0.9 % IV BOLUS (SEPSIS)
1000.0000 mL | Freq: Once | INTRAVENOUS | Status: AC
  Administered 2016-07-16: 13:00:00 1000 mL via INTRAVENOUS

## 2016-07-16 MED ORDER — SODIUM CHLORIDE 0.9 % IV SOLN
INTRAVENOUS | Status: DC
  Administered 2016-07-16 – 2016-07-18 (×3): via INTRAVENOUS

## 2016-07-16 NOTE — Progress Notes (Signed)
Initial Nutrition Assessment  DOCUMENTATION CODES:   Obesity unspecified  INTERVENTION:  Provide Ensure Enlive po BID, each supplement provides 350 kcal and 20 grams of protein.   Provide multivitamin with minerals daily.  NUTRITION DIAGNOSIS:   Increased nutrient needs related to wound healing as evidenced by estimated needs.  GOAL:   Patient will meet greater than or equal to 90% of their needs  MONITOR:   PO intake, Supplement acceptance, Labs, Weight trends, I & O's  REASON FOR ASSESSMENT:   Malnutrition Screening Tool    ASSESSMENT:   76 year old male with PMHx of CKD III, DM type 2, HTN, obstructive chronic bronchitis, PTSD, gout, diverticulosis, pulmonary embolism, incomplete paraplegia who presents [facility] found to have sepsis secondary to UTI.   Spoke with patient at bedside. He reports his appetite is good and unchanged from baseline. Patient reports he eats 2-3 meals per day and that he eats "as much as he wants." Patient was unable to clarify further on typical intake. He only reports indigestion and denies N/V, abdominal pain, constipation/diarrhea.  Patient reports UBW of 270 lbs. Per chart patient is only 2.4 lbs below reported UBW. No weight history in chart and patient's bed does not have a bed scale so unable to check weight. Patient reports he has been trying to lose some weight. After discussion regarding increased needs to heal wounds, patient is amenable to drink an oral nutrition supplement between meals.   Meal Completion: 100% of breakfast this morning (556 kcal, 25 grams of protein), but only bites of lunch today.  Medications reviewed and include: Novolog sliding scale TID with meals and daily at bedtime, Lantus 15 units daily, senna, warfarin.  Labs reviewed: CBG 84-164, BUN 50, Creatinine 1.86.   Nutrition-Focused physical exam completed. Findings are mild-moderate fat depletion, moderate muscle depletion, and moderate generalized  edema (per RN assessment patient with severe edema bilateral legs). Unable to assess muscle wasting or edema on patient's legs very well due to severe dryness and cracking.   Due to limited weight and nutrition history, unable to diagnose patient with malnutrition at this time. He is at risk due to wasting found on exam, but some degree of wasting is expected as patient is bed bound with incomplete paraplegia.   Diet Order:  Diet Carb Modified Fluid consistency: Thin; Room service appropriate? Yes  Skin:  Wound (see comment) (Stg II to sacrum, DTI right foot)  Last BM:  07/15/2016  Height:   Ht Readings from Last 1 Encounters:  07/15/16 6' 4.8" (1.951 m)    Weight:   Wt Readings from Last 1 Encounters:  07/15/16 267 lb 9.6 oz (121.4 kg)    Ideal Body Weight:  94 kg  BMI:  Body mass index is 31.9 kg/m.  Estimated Nutritional Needs:   Kcal:  2280-2490 (MSJ x 1.1-1.2)  Protein:  120-145 grams (1-1.2 grams/kg)  Fluid:  2.2 L/day  EDUCATION NEEDS:   Education needs addressed (Increased needs for wound healing (calories, protein, MVI). Discouraged patient from trying to lose weight until wounds healed. )  Willey Blade, MS, RD, LDN Pager: [phone] After Hours Pager: [phone]

## 2016-07-16 NOTE — Progress Notes (Signed)
Patient ID: Carlos Calderon, male   DOB: 1941-02-21, 76 y.o.   MRN: [id number]   [facility] PROGRESS NOTE  Carlos Calderon KGM:[id number] DOB: 1940-09-17 DOA: 07/14/2016 PCP: Alvester Morin, MD  HPI/Subjective: Called by the nurse that the patient's blood pressure was low 79/61. Patient felt a little bit funny but could not elaborate. Felt better soon as the IV fluids were hung even before the blood pressure came up.  Objective: Vitals:   07/16/16 1304 07/16/16 1512  BP: (!) 79/61 (!) 100/56  Pulse: (!) 127 (!) 121  Resp: 18 18  Temp: 97.6 F (36.4 C)     Filed Weights   07/14/16 1801 07/15/16 0000  Weight: 124.6 kg (274 lb 11.2 oz) 121.4 kg (267 lb 9.6 oz)    ROS: Review of Systems  Constitutional: Positive for malaise/fatigue. Negative for chills and fever.  Eyes: Negative for blurred vision.  Respiratory: Negative for cough and shortness of breath.   Cardiovascular: Negative for chest pain.  Gastrointestinal: Negative for abdominal pain, constipation, diarrhea, nausea and vomiting.  Genitourinary: Negative for dysuria.  Musculoskeletal: Negative for joint pain.  Neurological: Negative for dizziness and headaches.   Exam: Physical Exam  Constitutional: He is oriented to person, place, and time.  HENT:  Nose: No mucosal edema.  Mouth/Throat: No oropharyngeal exudate or posterior oropharyngeal edema.  Eyes: Conjunctivae, EOM and lids are normal. Pupils are equal, round, and reactive to light.  Neck: No JVD present. Carotid bruit is not present. No edema present. No thyroid mass and no thyromegaly present.  Cardiovascular: S1 normal and S2 normal.  Tachycardia present.  Exam reveals no gallop.   No murmur heard. Pulses:      Dorsalis pedis pulses are 2+ on the right side, and 2+ on the left side.  Respiratory: No respiratory distress. He has no wheezes. He has no rhonchi. He has no rales.  GI: Soft. Bowel sounds are normal. There is no tenderness.   Musculoskeletal:       Right knee: He exhibits swelling.       Left knee: He exhibits swelling.       Right ankle: He exhibits swelling.       Left ankle: He exhibits swelling.  Lymphadenopathy:    He has no cervical adenopathy.  Neurological: He is alert and oriented to person, place, and time.  Skin: Skin is warm. Nails show no clubbing.  Right heel small stage II decubiti without signs of infection. As per nurse sacral decubiti stage II without signs of infection. Scaling and dryness bilateral lower extremities  Psychiatric: He has a normal mood and affect.      Data Reviewed: Basic Metabolic Panel:  Recent Labs Lab 07/14/16 1810 07/15/16 0024 07/15/16 0509 07/16/16 0445  NA 136  --  138 136  K 3.6  --  3.6 3.5  CL 99*  --  104 104  CO2 28  --  27 25  GLUCOSE 176*  --  150* 100*  BUN 51*  --  50* 50*  CREATININE 1.94* 1.94* 1.84* 1.86*  CALCIUM 9.2  --  8.7* 8.7*   Liver Function Tests:  Recent Labs Lab 07/14/16 1810  AST 34  ALT 24  ALKPHOS 132*  BILITOT 0.7  PROT 7.5  ALBUMIN 3.3*   CBC:  Recent Labs Lab 07/14/16 1810 07/15/16 0509  WBC 9.8 7.6  NEUTROABS 6.7*  --   HGB 13.3 11.6*  HCT 39.3* 34.4*  MCV 93.2 93.3  PLT 279 216  CBG:  Recent Labs Lab 07/15/16 1655 07/15/16 2049 07/16/16 0748 07/16/16 1158 07/16/16 1337  GLUCAP 153* 120* 84 164* 161*    Recent Results (from the past 240 hour(s))  Culture, blood (Routine x 2)     Status: Abnormal (Preliminary result)   Collection Time: 07/14/16  6:10 PM  Result Value Ref Range Status   Specimen Description BLOOD BLOOD LEFT HAND  Final   Special Requests   Final    BOTTLES DRAWN AEROBIC AND ANAEROBIC ADEQUATE VOLUME   Culture  Setup Time   Final    GRAM POSITIVE COCCI IN BOTH AEROBIC AND ANAEROBIC BOTTLES CRITICAL RESULT CALLED TO, READ BACK BY AND VERIFIED WITH: KAREN HAYES ON 07/15/16 AT 1022 [place]    Culture (A)  Final    STAPHYLOCOCCUS SPECIES (COAGULASE NEGATIVE) THE  SIGNIFICANCE OF ISOLATING THIS ORGANISM FROM A SINGLE SET OF BLOOD CULTURES WHEN MULTIPLE SETS ARE DRAWN IS UNCERTAIN. PLEASE NOTIFY THE MICROBIOLOGY DEPARTMENT WITHIN ONE WEEK IF SPECIATION AND SENSITIVITIES ARE REQUIRED. Performed at [facility], [place] [address] [place], [place] [address]    Report Status PENDING  Incomplete  Culture, blood (Routine x 2)     Status: None (Preliminary result)   Collection Time: 07/14/16  6:10 PM  Result Value Ref Range Status   Specimen Description BLOOD RIGHT ANTECUBITAL  Final   Special Requests   Final    BOTTLES DRAWN AEROBIC AND ANAEROBIC ADEQUATE VOLUME   Culture NO GROWTH 2 DAYS  Final   Report Status PENDING  Incomplete  Blood Culture ID Panel (Reflexed)     Status: Abnormal   Collection Time: 07/14/16  6:10 PM  Result Value Ref Range Status   Enterococcus species NOT DETECTED NOT DETECTED Final   Listeria monocytogenes NOT DETECTED NOT DETECTED Final   Staphylococcus species DETECTED (A) NOT DETECTED Final    Comment: Methicillin (oxacillin) resistant coagulase negative staphylococcus. Possible blood culture contaminant (unless isolated from more than one blood culture draw or clinical case suggests pathogenicity). No antibiotic treatment is indicated for blood  culture contaminants. CRITICAL RESULT CALLED TO, READ BACK BY AND VERIFIED WITH: Charlane Ferretti @ [phone] 07/15/16 by [facility]    Staphylococcus aureus NOT DETECTED NOT DETECTED Final   Methicillin resistance DETECTED (A) NOT DETECTED Final    Comment: CRITICAL RESULT CALLED TO, READ BACK BY AND VERIFIED WITH: Charlane Ferretti @ [phone] 07/15/16 by [facility]    Streptococcus species NOT DETECTED NOT DETECTED Final   Streptococcus agalactiae NOT DETECTED NOT DETECTED Final   Streptococcus pneumoniae NOT DETECTED NOT DETECTED Final   Streptococcus pyogenes NOT DETECTED NOT DETECTED Final   Acinetobacter baumannii NOT DETECTED NOT DETECTED Final   Enterobacteriaceae species NOT DETECTED NOT DETECTED Final    Enterobacter cloacae complex NOT DETECTED NOT DETECTED Final   Escherichia coli NOT DETECTED NOT DETECTED Final   Klebsiella oxytoca NOT DETECTED NOT DETECTED Final   Klebsiella pneumoniae NOT DETECTED NOT DETECTED Final   Proteus species NOT DETECTED NOT DETECTED Final   Serratia marcescens NOT DETECTED NOT DETECTED Final   Haemophilus influenzae NOT DETECTED NOT DETECTED Final   Neisseria meningitidis NOT DETECTED NOT DETECTED Final   Pseudomonas aeruginosa NOT DETECTED NOT DETECTED Final   Candida albicans NOT DETECTED NOT DETECTED Final   Candida glabrata NOT DETECTED NOT DETECTED Final   Candida krusei NOT DETECTED NOT DETECTED Final   Candida parapsilosis NOT DETECTED NOT DETECTED Final   Candida tropicalis NOT DETECTED NOT DETECTED Final  Urine culture  Status: Abnormal (Preliminary result)   Collection Time: 07/14/16  7:10 PM  Result Value Ref Range Status   Specimen Description URINE, CATHETERIZED  Final   Special Requests NONE  Final   Culture >=100,000 COLONIES/mL ESCHERICHIA COLI (A)  Final   Report Status PENDING  Incomplete  MRSA PCR Screening     Status: None   Collection Time: 07/15/16  1:40 AM  Result Value Ref Range Status   MRSA by PCR NEGATIVE NEGATIVE Final    Comment:        The GeneXpert MRSA Assay (FDA approved for NASAL specimens only), is one component of a comprehensive MRSA colonization surveillance program. It is not intended to diagnose MRSA infection nor to guide or monitor treatment for MRSA infections.      Studies: Dg Chest Portable 1 View  Result Date: 07/14/2016 CLINICAL DATA:  76 year old male with sepsis EXAM: PORTABLE CHEST 1 VIEW COMPARISON:  None. FINDINGS: There is a shallow inspiration with bibasilar atelectatic changes, left greater right. There is no focal consolidation, pleural effusion, or pneumothorax. There is mild cardiomegaly with mild central vascular prominence which may either mild vascular congestion. No acute  osseous pathology identified. IMPRESSION: Mild cardiomegaly with possible mild vascular congestion. No focal consolidation. Electronically Signed   By: Anner Crete M.D.   On: 07/14/2016 19:01    Scheduled Meds: . hydrocerin   Topical BID  . insulin aspart  0-5 Units Subcutaneous QHS  . insulin aspart  0-9 Units Subcutaneous TID WC  . insulin glargine  15 Units Subcutaneous q morning - 10a  . mometasone-formoterol  2 puff Inhalation BID  . piperacillin-tazobactam (ZOSYN)  IV  3.375 g Intravenous Q8H  . senna-docusate  1 tablet Oral Daily  . sodium chloride  1,000 mL Intravenous Once  . tamsulosin  0.4 mg Oral Daily  . warfarin  2 mg Oral Q T,Th,S,Su-1800  . warfarin  4 mg Oral Q M,W,F-1800  . Warfarin - Pharmacist Dosing Inpatient   Does not apply q1800    Assessment/Plan:  1. Clinical sepsis documented by admitting physician. Foley catheter changed in the ER. Patient placed on vancomycin and Zosyn. Blood cultures likely contamination so vancomycin was discontinued. Urine culture growing gram-negative rods. Awaiting ID and sensitivities 2. Hypotension and tachycardia. Patient responding to fluid bolus. In speaking with the granddaughter, the patient is on metoprolol for tachycardia and hypertension. Hopefully I can restart a low dose of metoprolol to improve heart rate and hopefully blood pressure will improve. 3. History of pulmonary embolism on Coumadin. Pharmacy dosing 4. Chronic Foley catheter. With BPH on Flomax Recommend changing catheter every 3 weeks 5. Dryness of the lower extremity on Eucerin cream 6. Type 2 diabetes mellitus on Lantus and sliding scale 7. History of CVA and being bedbound On Coumadin 8. Stage II sacral decubiti. Local wound care 9. Stage II right heel decubiti visualized by me. Looks like it's healing well. 10. Chronic kidney disease stage III. Continue to monitor 11. Family and she is to do another placement option 12. Patient was interested in the [place]  transfer. I spoke with Dr. Delmar Landau, he will check back later on the patient's blood pressure and heart rate to determine on whether the patient can be transferred over there this evening.  Code Status:     Code Status Orders        Start     Ordered   07/14/16 2352  Full code  Continuous     07/14/16 2351  Code Status History    Date Active Date Inactive Code Status Order ID Comments User Context   This patient has a current code status but no historical code status.    Advance Directive Documentation     Most Recent Value  Type of Advance Directive  Healthcare Power of Attorney  Pre-existing out of facility DNR order (yellow form or pink MOST form)  -  "MOST" Form in Place?  -     Disposition Plan: Potential transfer to the [place].  Antibiotics:  Zosyn  Time spent: 30 minutes  Loletha Grayer  [employer]

## 2016-07-16 NOTE — Progress Notes (Signed)
ANTICOAGULATION CONSULT NOTE - Follow Up Consult  Pharmacy Consult for warfarin dosing Indication: h/o pulmonary embolus  Allergies  Allergen Reactions  . Allopurinol   . Hctz [Hydrochlorothiazide]   . Oysters [facility]]     Patient Measurements: Height: 6' 4.8" (195.1 cm) Weight: 267 lb 9.6 oz (121.4 kg) IBW/kg (Calculated) : 88.64 Heparin Dosing Weight:    Vital Signs: Temp: 97.9 F (36.6 C) (03/19 0400) Temp Source: Oral (03/19 0400) BP: 92/57 (03/19 0400) Pulse Rate: 87 (03/19 0400)  Labs:  Recent Labs  07/14/16 1810 07/15/16 0024 07/15/16 0509 07/15/16 0838 07/16/16 0445  HGB 13.3  --  11.6*  --   --   HCT 39.3*  --  34.4*  --   --   PLT 279  --  216  --   --   LABPROT 22.2*  --   --  24.3* 23.9*  INR 1.92  --   --  2.14 2.10  CREATININE 1.94* 1.94* 1.84*  --  1.86*    Estimated Creatinine Clearance: 50.1 mL/min (A) (by C-G formula based on SCr of 1.86 mg/dL (H)).   Assessment: Patient admitted for N/V and is anticoagulated w/ warfarin for h/o of PE. Patient takes warfarin: Warfarin 4 mg on MWF Warfarin 2 mg on TThSaSun  3/17  INR 1.92 3/18  INR  2.14 3/19: INR 2.10    Goal of Therapy:  INR 2-3 Monitor platelets by anticoagulation protocol: Yes   Plan:  Will continue home dose of warfarin 2 mg on TThSaSun and 4 mg on MWF. Will follow daily INRs while on antibiotics and continue to monitor CBC   Theodora Lalanne D 07/16/2016,8:04 AM

## 2016-07-16 NOTE — Care Management (Signed)
Admitted to [facility] with the diagnosis of sepsis. Admitted from [facility]. Received telephone call from Mickel Baas at [facility]. Needs disposition. Spoke with Mr. Viviano at the  Bedside. States that he would like to be transferred to [place] obtained from Mr. Christiansen and Dr. Leslye Peer. Will Fax to [place]. [place] Management

## 2016-07-17 LAB — CBC
HCT: 30.2 % — ABNORMAL LOW (ref 40.0–52.0)
Hemoglobin: 10.2 g/dL — ABNORMAL LOW (ref 13.0–18.0)
MCH: 31.8 pg (ref 26.0–34.0)
MCHC: 33.9 g/dL (ref 32.0–36.0)
MCV: 94 fL (ref 80.0–100.0)
PLATELETS: 183 10*3/uL (ref 150–440)
RBC: 3.22 MIL/uL — AB (ref 4.40–5.90)
RDW: 17.9 % — ABNORMAL HIGH (ref 11.5–14.5)
WBC: 6.4 10*3/uL (ref 3.8–10.6)

## 2016-07-17 LAB — BASIC METABOLIC PANEL
Anion gap: 6 (ref 5–15)
BUN: 48 mg/dL — AB (ref 6–20)
CO2: 25 mmol/L (ref 22–32)
Calcium: 8.3 mg/dL — ABNORMAL LOW (ref 8.9–10.3)
Chloride: 105 mmol/L (ref 101–111)
Creatinine, Ser: 1.98 mg/dL — ABNORMAL HIGH (ref 0.61–1.24)
GFR calc Af Amer: 37 mL/min — ABNORMAL LOW (ref >60)
GFR, EST NON AFRICAN AMERICAN: 32 mL/min — AB (ref >60)
GLUCOSE: 99 mg/dL (ref 65–99)
POTASSIUM: 3.4 mmol/L — AB (ref 3.5–5.1)
SODIUM: 136 mmol/L (ref 135–145)

## 2016-07-17 LAB — CULTURE, BLOOD (ROUTINE X 2): Special Requests: ADEQUATE

## 2016-07-17 LAB — GLUCOSE, CAPILLARY
GLUCOSE-CAPILLARY: 89 mg/dL (ref 65–99)
GLUCOSE-CAPILLARY: 165 mg/dL — AB (ref 65–99)
Glucose-Capillary: 153 mg/dL — ABNORMAL HIGH (ref 65–99)
Glucose-Capillary: 170 mg/dL — ABNORMAL HIGH (ref 65–99)
Glucose-Capillary: 178 mg/dL — ABNORMAL HIGH (ref 65–99)

## 2016-07-17 LAB — PROTIME-INR
INR: 1.96
PROTHROMBIN TIME: 22.6 s — AB (ref 11.4–15.2)

## 2016-07-17 MED ORDER — WARFARIN SODIUM 1 MG PO TABS
3.0000 mg | ORAL_TABLET | Freq: Once | ORAL | Status: AC
  Administered 2016-07-17: 3 mg via ORAL
  Filled 2016-07-17: qty 3

## 2016-07-17 MED ORDER — SODIUM CHLORIDE 0.9 % IV SOLN: 50.0000 mL | INTRAVENOUS | 0 refills | Status: DC

## 2016-07-17 MED ORDER — SODIUM CHLORIDE 0.9 % IV BOLUS (SEPSIS)
1000.0000 mL | Freq: Once | INTRAVENOUS | Status: AC
  Administered 2016-07-17: 1000 mL via INTRAVENOUS

## 2016-07-17 MED ORDER — METOPROLOL TARTRATE 25 MG PO TABS: 12.5000 mg | ORAL_TABLET | Freq: Two times a day (BID) | ORAL | Status: AC

## 2016-07-17 MED ORDER — POTASSIUM CHLORIDE CRYS ER 20 MEQ PO TBCR
20.0000 meq | EXTENDED_RELEASE_TABLET | Freq: Once | ORAL | Status: AC
  Administered 2016-07-17: 20 meq via ORAL
  Filled 2016-07-17: qty 1

## 2016-07-17 MED ORDER — POLYETHYLENE GLYCOL 3350 17 G PO PACK
17.0000 g | PACK | Freq: Every day | ORAL | Status: DC
  Administered 2016-07-17 – 2016-07-19 (×2): 17 g via ORAL
  Filled 2016-07-17 (×4): qty 1

## 2016-07-17 MED ORDER — PIPERACILLIN-TAZOBACTAM 3.375 G IVPB: 3.3750 g | Freq: Three times a day (TID) | INTRAVENOUS | Status: DC

## 2016-07-17 MED ORDER — FLEET ENEMA 7-19 GM/118ML RE ENEM
1.0000 | ENEMA | Freq: Once | RECTAL | Status: AC
  Administered 2016-07-17: 18:00:00 1 via RECTAL

## 2016-07-17 MED ORDER — ENSURE ENLIVE PO LIQD: 237.0000 mL | Freq: Two times a day (BID) | ORAL | 12 refills | Status: AC

## 2016-07-17 MED ORDER — GI COCKTAIL ~~LOC~~
30.0000 mL | Freq: Once | ORAL | Status: AC
  Administered 2016-07-17: 30 mL via ORAL
  Filled 2016-07-17: qty 30

## 2016-07-17 MED ORDER — WARFARIN SODIUM 1 MG PO TABS: 2.0000 mg | ORAL_TABLET | ORAL | Status: DC

## 2016-07-17 NOTE — NC FL2 (Signed)
[place] LEVEL OF CARE SCREENING TOOL     IDENTIFICATION  Patient Name: Carlos Calderon Birthdate: 19-Mar-1941 Sex: male Admission Date (Current Location): 07/14/2016  [place] and [place] Number:  Engineering geologist and Address:  [facility], [address] [place], [place] [address]      Provider Number: [id number]  Attending Physician Name and Address:  Loletha Grayer, MD  Relative Name and Phone Number:       Current Level of Care: Hospital Recommended Level of Care: [place] Prior Approval Number:    Date Approved/Denied:   PASRR Number: [id number]  Discharge Plan: SNF    Current Diagnoses: Patient Active Problem List   Diagnosis Date Noted  . Pressure injury of skin 07/16/2016  . Sepsis secondary to UTI ([place]) 07/14/2016  . Hypotension 07/14/2016  . Type 1 diabetes mellitus without complication ([place]) 70/62/3762  . BPH with urinary obstruction 07/14/2016  . Chronic anticoagulation 07/14/2016  . UTI (urinary tract infection) 07/14/2016    Orientation RESPIRATION BLADDER Height & Weight     Self, Place, Situation  Normal, O2 (2 liters) Continent Weight: 267 lb 9.6 oz (121.4 kg) Height:  6' 4.8" (195.1 cm)  BEHAVIORAL SYMPTOMS/MOOD NEUROLOGICAL BOWEL NUTRITION STATUS   (none)  (none) Incontinent Diet (carb modified)  AMBULATORY STATUS COMMUNICATION OF NEEDS Skin   Total Care Verbally Normal, PU Stage and Appropriate Care                       Personal Care Assistance Level of Assistance  Total care       Total Care Assistance: Maximum assistance   Functional Limitations Info   (none listed)          SPECIAL CARE FACTORS FREQUENCY                       Contractures Contractures Info: Not present    Additional Factors Info  Code Status, Allergies Code Status Info: full Allergies Info: allopurinol, hctz, oysters, shellfish           Current Medications (07/17/2016):  This is  the current hospital active medication list Current Facility-Administered Medications  Medication Dose Route Frequency Provider Last Rate Last Dose  . 0.9 %  sodium chloride infusion   Intravenous Continuous Loletha Grayer, MD 100 mL/hr at 07/17/16 0606    . acetaminophen (TYLENOL) tablet 650 mg  650 mg Oral Q6H PRN Debby Crosley, MD       Or  . acetaminophen (TYLENOL) suppository 650 mg  650 mg Rectal Q6H PRN Debby Crosley, MD      . feeding supplement (ENSURE ENLIVE) (ENSURE ENLIVE) liquid 237 mL  237 mL Oral BID BM Loletha Grayer, MD   237 mL at 07/17/16 1019  . hydrocerin (EUCERIN) cream   Topical BID Loletha Grayer, MD      . insulin aspart (novoLOG) injection 0-5 Units  0-5 Units Subcutaneous QHS Loletha Grayer, MD      . insulin aspart (novoLOG) injection 0-9 Units  0-9 Units Subcutaneous TID WC Loletha Grayer, MD   2 Units at 07/16/16 1757  . insulin glargine (LANTUS) injection 15 Units  15 Units Subcutaneous q morning - 10a Debby Crosley, MD   15 Units at 07/17/16 1015  . metoprolol tartrate (LOPRESSOR) tablet 12.5 mg  12.5 mg Oral BID Loletha Grayer, MD   12.5 mg at 07/16/16 2213  . mometasone-formoterol (DULERA) 200-5 MCG/ACT inhaler 2 puff  2 puff Inhalation BID Quintella Baton, MD   2 puff at 07/17/16 0742  . multivitamin with minerals tablet 1 tablet  1 tablet Oral Daily Loletha Grayer, MD   1 tablet at 07/17/16 1015  . piperacillin-tazobactam (ZOSYN) IVPB 3.375 g  3.375 g Intravenous Q8H Loletha Grayer, MD   3.375 g at 07/17/16 1015  . polyethylene glycol (MIRALAX / GLYCOLAX) packet 17 g  17 g Oral Daily Loletha Grayer, MD   17 g at 07/17/16 1015  . senna-docusate (Senokot-S) tablet 1 tablet  1 tablet Oral Daily Quintella Baton, MD   1 tablet at 07/17/16 1015  . sodium chloride 0.9 % bolus 1,000 mL  1,000 mL Intravenous Once Loletha Grayer, MD      . Derrill Memo ON 07/18/2016] warfarin (COUMADIN) tablet 2 mg  2 mg Oral Q T,Th,S,Su-1800 Debby Crosley, MD      . warfarin (COUMADIN)  tablet 3 mg  3 mg Oral ONCE-1800 Debby Crosley, MD      . warfarin (COUMADIN) tablet 4 mg  4 mg Oral Q M,W,F-1800 Debby Crosley, MD   4 mg at 07/16/16 1757  . Warfarin - Pharmacist Dosing Inpatient   Does not apply q1800 Quintella Baton, MD         Discharge Medications: Please see discharge summary for a list of discharge medications.  Relevant Imaging Results:  Relevant Lab Results:   Additional Information ss: [id number]  Shela Leff, LCSW

## 2016-07-17 NOTE — Discharge Summary (Addendum)
[facility] [place] NAME: Carlos Calderon    MR#:  [id number]  DATE OF BIRTH:  [birth year]  DATE OF ADMISSION:  07/14/2016 ADMITTING PHYSICIAN: Quintella Baton, MD  DATE OF Transfer to [facility]:  07/18/2016  PRIMARY CARE PHYSICIAN: Alvester Morin, MD    ADMISSION DIAGNOSIS:  Malaise [R53.81] Tachycardia [R00.0] Elevated lactic acid level [R79.89]  DISCHARGE DIAGNOSIS:  Principal Problem:   Sepsis secondary to UTI [facility]) Active Problems:   Hypotension   Type 1 diabetes mellitus without complication (HCC)   BPH with urinary obstruction   Chronic anticoagulation   UTI (urinary tract infection)   Pressure injury of skin   SECONDARY DIAGNOSIS:   Past Medical History:  Diagnosis Date  . Acute renal failure syndrome ([place])   . Atopic rhinitis   . Benign enlargement of prostate   . 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 Chronic kidney disease, stage III (moderate)   . CN (constipation)   . Coronary atherosclerosis of native coronary artery   . Diverticulosis of large intestine without diverticulitis   . Encounter for long-term (current) use of non-steroidal anti-inflammatories   . Esophageal reflux   . Essential hypertension, malignant   . Gout, joint   . Hyperlipemia   . 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 Left paraspinal back pain   . Muscle weakness (generalized)   . Neurogenic dysfunction of the urinary bladder   . Normocytic hypochromic anemia   . Obstructive chronic bronchitis without exacerbation ([place])   . Personal history of PE (pulmonary embolism)   . Personal history of transient cerebral ischemia   . Posttraumatic stress disorder   . Pure hypercholesterolemia   . Severe recurrent major depression without psychotic features ([place])   . Type 2 diabetes mellitus with end-stage renal disease [facility])     HOSPITAL COURSE:   1. Clinical sepsis documented by admitting  physician. Foley catheter changed in the emergency room. Patient initially placed on vancomycin and Zosyn. Blood cultures came back 2 bottles out of 4 with staph species which is likely contamination, so the vancomycin was discontinued. Urine culture growing Escherichia coli. Zosyn switched over to IV Invanz because initially they told me it was ESBL. Later was confirmed that this is not ESBL. It is sensitive to Rocephin. Switch over to Rocephin starting tomorrow. Potentially can be discharged on Keflex for completion of course. 2. Hypotension and tachycardia. Patient's blood pressure is on the lower side. Try to discontinue IV fluid hydration and continue low-dose metoprolol 12.5 mg twice a day to help control heart rate. 3. History of pulmonary embolism on Coumadin which pharmacy is dosing. 4. Chronic Foley catheter with BPH. I had to hold the Flomax with hypotension. Recommend changing the catheter every 3 weeks. 5. Dryness of the lower extremity on Eucerin cream while here. 6. Type 2 diabetes mellitus on Lantus and sliding scale 7. History of CVA and being bedbound. On Coumadin. Atorvastatin 8. Stage II sacral decubiti. Local wound care 9. Stage II right heel decubiti. Local wound care 10. Family wants another placement option 11. [facility] 12. Hyperlipidemia unspecified on atorvastatin 13. Chronic kidney disease stage III. Monitor with IV fluid hydration and holding torsemide  DISCHARGE CONDITIONS:   Satisfactory  CONSULTS OBTAINED:   none  DRUG ALLERGIES:   Allergies  Allergen Reactions  . Allopurinol   . Hctz [Hydrochlorothiazide]   . Oysters [Shellfish Allergy]  DISCHARGE MEDICATIONS:   Current Discharge Medication List    START taking these medications   Details  cefTRIAXone 1 g in dextrose 5 % 50 mL Inject 1 g into the vein daily.    feeding supplement, ENSURE ENLIVE, (ENSURE ENLIVE) LIQD Take 237 mLs by mouth 2 (two) times daily between  meals. Qty: 237 mL, Refills: 12    metoprolol tartrate (LOPRESSOR) 25 MG tablet Take 0.5 tablets (12.5 mg total) by mouth 2 (two) times daily.      CONTINUE these medications which have NOT CHANGED   Details  acetaminophen (TYLENOL) 325 MG tablet Take 650 mg by mouth every 4 (four) hours as needed.    atorvastatin (LIPITOR) 40 MG tablet Take 40 mg by mouth at bedtime.    budesonide-formoterol (SYMBICORT) 160-4.5 MCG/ACT inhaler Inhale 2 puffs into the lungs 2 (two) times daily.    Cranberry 450 MG CAPS Take 1 capsule by mouth daily.    insulin glargine (LANTUS) 100 UNIT/ML injection Inject 15 Units into the skin every morning.    Multiple Vitamin (MULTIVITAMIN) tablet Take 1 tablet by mouth daily.    nicotine (NICODERM CQ - DOSED IN MG/24 HOURS) 14 mg/24hr patch Place 14 mg onto the skin daily.    sennosides-docusate sodium (SENOKOT-S) 8.6-50 MG tablet Take 1 tablet by mouth daily.    umeclidinium bromide (INCRUSE ELLIPTA) 62.5 MCG/INH AEPB Inhale 1 puff into the lungs daily.    urea (CARMOL) 20 % cream Apply 1 application topically 2 (two) times daily.    !! warfarin (COUMADIN) 2 MG tablet Take 2 mg by mouth See admin instructions. Tuesday, Thursday, Saturday, and Sunday    !! warfarin (COUMADIN) 4 MG tablet Take 4 mg by mouth See admin instructions. Monday, Wednesday, and Friday     !! - Potential duplicate medications found. Please discuss with provider.    STOP taking these medications     metoprolol succinate (TOPROL-XL) 100 MG 24 hr tablet      tamsulosin (FLOMAX) 0.4 MG CAPS capsule      torsemide (DEMADEX) 20 MG tablet          DISCHARGE INSTRUCTIONS:   Follow-up at the [facility] one day  If you experience worsening of your admission symptoms, develop shortness of breath, life threatening emergency, suicidal or homicidal thoughts you must seek medical attention immediately by calling 911 or calling your MD immediately  if symptoms less severe.  You  Must read complete instructions/literature along with all the possible adverse reactions/side effects for all the Medicines you take and that have been prescribed to you. Take any new Medicines after you have completely understood and accept all the possible adverse reactions/side effects.   Please note  You were cared for by a hospitalist during your hospital stay. If you have any questions about your discharge medications or the care you received while you were in the hospital after you are discharged, you can call the unit and asked to speak with the hospitalist on call if the hospitalist that took care of you is not available. Once you are discharged, your primary care physician will handle any further medical issues. Please note that NO REFILLS for any discharge medications will be authorized once you are discharged, as it is imperative that you return to your primary care physician (or establish a relationship with a primary care physician if you do not have one) for your aftercare needs so that they can reassess your need for medications and monitor your  lab values.    Today   CHIEF COMPLAINT:   Chief Complaint  Patient presents with  . Code Sepsis  . Altered Mental Status  . Hypotension  . Tachycardia    HISTORY OF PRESENT ILLNESS:  Carlos Calderon  is a 76 y.o. male brought in with altered mental status, hypotension and tachycardia and found to be in clinical sepsis   VITAL SIGNS:  Blood pressure 126/71, pulse 81, temperature 98.2 F (36.8 C), resp. rate 20, height 6' 4.8" (1.951 m), weight 121.4 kg (267 lb 9.6 oz), SpO2 100 %.   PHYSICAL EXAMINATION:  GENERAL:  76 y.o.-year-old patient lying in the bed with no acute distress.  EYES: Pupils equal, round, reactive to light and accommodation. No scleral icterus. Extraocular muscles intact. Some swelling bilateral eyelids upper and lower HEENT: Head atraumatic, normocephalic. Oropharynx and nasopharynx clear.  NECK:  Supple, no  jugular venous distention. No thyroid enlargement, no tenderness.  LUNGS: Decreased breath sounds bilaterally, no wheezing, rales,rhonchi or crepitation. No use of accessory muscles of respiration.  CARDIOVASCULAR: S1, S2 Tachycardia. No murmurs, rubs, or gallops.  ABDOMEN: Soft, non-tender, non-distended. Bowel sounds present. No organomegaly or mass.  EXTREMITIES: 4+ leg edema, no cyanosis, or clubbing.  NEUROLOGIC: Sensation intact. Gait not checked.  PSYCHIATRIC: The patient is alert and oriented x 3.  SKIN: Stage II decubiti right heel and sacral area. Puffiness seen both eyelids bilaterally  DATA REVIEW:   CBC  Recent Labs Lab 07/18/16 0636  WBC 6.1  HGB 10.2*  HCT 30.6*  PLT 179    Chemistries   Recent Labs Lab 07/14/16 1810  07/18/16 0636  NA 136  < > 135  K 3.6  < > 4.0  CL 99*  < > 105  CO2 28  < > 25  GLUCOSE 176*  < > 155*  BUN 51*  < > 45*  CREATININE 1.94*  < > 1.89*  CALCIUM 9.2  < > 8.5*  AST 34  --   --   ALT 24  --   --   ALKPHOS 132*  --   --   BILITOT 0.7  --   --   < > = values in this interval not displayed.   Microbiology Results  Results for orders placed or performed during the hospital encounter of 07/14/16  Culture, blood (Routine x 2)     Status: Abnormal   Collection Time: 07/14/16  6:10 PM  Result Value Ref Range Status   Specimen Description BLOOD BLOOD LEFT HAND  Final   Special Requests   Final    BOTTLES DRAWN AEROBIC AND ANAEROBIC ADEQUATE VOLUME   Culture  Setup Time   Final    GRAM POSITIVE COCCI IN BOTH AEROBIC AND ANAEROBIC BOTTLES CRITICAL RESULT CALLED TO, READ BACK BY AND VERIFIED WITH: KAREN HAYES ON 07/15/16 AT 1022 [place]    Culture (A)  Final    STAPHYLOCOCCUS SPECIES (COAGULASE NEGATIVE) THE SIGNIFICANCE OF ISOLATING THIS ORGANISM FROM A SINGLE SET OF BLOOD CULTURES WHEN MULTIPLE SETS ARE DRAWN IS UNCERTAIN. PLEASE NOTIFY THE MICROBIOLOGY DEPARTMENT WITHIN ONE WEEK IF SPECIATION AND SENSITIVITIES ARE  REQUIRED. Performed at [facility], [place] [address] [place], [place] [address]    Report Status 07/17/2016 FINAL  Final  Culture, blood (Routine x 2)     Status: None (Preliminary result)   Collection Time: 07/14/16  6:10 PM  Result Value Ref Range Status   Specimen Description BLOOD RIGHT ANTECUBITAL  Final  Special Requests   Final    BOTTLES DRAWN AEROBIC AND ANAEROBIC ADEQUATE VOLUME   Culture NO GROWTH 4 DAYS  Final   Report Status PENDING  Incomplete  Blood Culture ID Panel (Reflexed)     Status: Abnormal   Collection Time: 07/14/16  6:10 PM  Result Value Ref Range Status   Enterococcus species NOT DETECTED NOT DETECTED Final   Listeria monocytogenes NOT DETECTED NOT DETECTED Final   Staphylococcus species DETECTED (A) NOT DETECTED Final    Comment: Methicillin (oxacillin) resistant coagulase negative staphylococcus. Possible blood culture contaminant (unless isolated from more than one blood culture draw or clinical case suggests pathogenicity). No antibiotic treatment is indicated for blood  culture contaminants. CRITICAL RESULT CALLED TO, READ BACK BY AND VERIFIED WITH: Charlane Ferretti @ 1751 07/15/16 by [facility]    Staphylococcus aureus NOT DETECTED NOT DETECTED Final   Methicillin resistance DETECTED (A) NOT DETECTED Final    Comment: CRITICAL RESULT CALLED TO, READ BACK BY AND VERIFIED WITH: Charlane Ferretti @ 0258 07/15/16 by [facility]    Streptococcus species NOT DETECTED NOT DETECTED Final   Streptococcus agalactiae NOT DETECTED NOT DETECTED Final   Streptococcus pneumoniae NOT DETECTED NOT DETECTED Final   Streptococcus pyogenes NOT DETECTED NOT DETECTED Final   Acinetobacter baumannii NOT DETECTED NOT DETECTED Final   Enterobacteriaceae species NOT DETECTED NOT DETECTED Final   Enterobacter cloacae complex NOT DETECTED NOT DETECTED Final   Escherichia coli NOT DETECTED NOT DETECTED Final   Klebsiella oxytoca NOT DETECTED NOT DETECTED Final   Klebsiella pneumoniae NOT DETECTED  NOT DETECTED Final   Proteus species NOT DETECTED NOT DETECTED Final   Serratia marcescens NOT DETECTED NOT DETECTED Final   Haemophilus influenzae NOT DETECTED NOT DETECTED Final   Neisseria meningitidis NOT DETECTED NOT DETECTED Final   Pseudomonas aeruginosa NOT DETECTED NOT DETECTED Final   Candida albicans NOT DETECTED NOT DETECTED Final   Candida glabrata NOT DETECTED NOT DETECTED Final   Candida krusei NOT DETECTED NOT DETECTED Final   Candida parapsilosis NOT DETECTED NOT DETECTED Final   Candida tropicalis NOT DETECTED NOT DETECTED Final  Urine culture     Status: Abnormal   Collection Time: 07/14/16  7:10 PM  Result Value Ref Range Status   Specimen Description URINE, CATHETERIZED  Final   Special Requests NONE  Final   Culture >=100,000 COLONIES/mL ESCHERICHIA COLI (A)  Final   Report Status 07/18/2016 FINAL  Final   Organism ID, Bacteria ESCHERICHIA COLI (A)  Final      Susceptibility   Escherichia coli - MIC*    AMPICILLIN >=32 RESISTANT Resistant     CEFAZOLIN <=4 SENSITIVE Sensitive     CEFTRIAXONE <=1 SENSITIVE Sensitive     CIPROFLOXACIN >=4 RESISTANT Resistant     GENTAMICIN <=1 SENSITIVE Sensitive     IMIPENEM <=0.25 SENSITIVE Sensitive     NITROFURANTOIN <=16 SENSITIVE Sensitive     TRIMETH/SULFA >=320 RESISTANT Resistant     AMPICILLIN/SULBACTAM >=32 RESISTANT Resistant     PIP/TAZO >=128 RESISTANT Resistant     * >=100,000 COLONIES/mL ESCHERICHIA COLI  MRSA PCR Screening     Status: None   Collection Time: 07/15/16  1:40 AM  Result Value Ref Range Status   MRSA by PCR NEGATIVE NEGATIVE Final    Comment:        The GeneXpert MRSA Assay (FDA approved for NASAL specimens only), is one component of a comprehensive MRSA colonization surveillance program. It is not intended to diagnose MRSA infection nor  to guide or monitor treatment for MRSA infections.      Management plans discussed with the patient, And he is in agreement.  I spoke with  granddaughter about potential transfer to the [place] yesterday evening.  CODE STATUS:     Code Status Orders        Start     Ordered   07/14/16 2352  Full code  Continuous     07/14/16 2351    Code Status History    Date Active Date Inactive Code Status Order ID Comments User Context   This patient has a current code status but no historical code status.    Advance Directive Documentation     Most Recent Value  Type of Advance Directive  Healthcare Power of Attorney  Pre-existing out of facility DNR order (yellow form or pink MOST form)  -  "MOST" Form in Place?  -      TOTAL TIME TAKING CARE OF THIS PATIENT: 35 minutes.    Loletha Grayer M.D on 07/18/2016 at 4:02 PM  Between 7am to 6pm - Pager - [phone]  After 6pm go to [email or web address] - password [email or web address]  [employer] Office  [phone]  CC: Primary care physician; Alvester Morin, MD

## 2016-07-17 NOTE — Care Management Important Message (Signed)
Important Message  Patient Details  Name: Carlos Calderon MRN: [id number] Date of Birth: [birth year]   Medicare Important Message Given:  Yes    Shelbie Ammons, RN 07/17/2016, 9:36 AM

## 2016-07-17 NOTE — Progress Notes (Signed)
Patient ID: Carlos Calderon, male   DOB: January 30, 1941, 76 y.o.   MRN: [id number]    [employer] PROGRESS NOTE  Carlos Calderon JOI:[id number] DOB: 03-Dec-1940 DOA: 07/14/2016 PCP: Alvester Morin, MD  HPI/Subjective: Patient was accepted over to the [facility] but no beds yet. Called by the nurse that his heart rate is a little up at 124. Blood pressure acceptable right now. Advised to give the low-dose metoprolol that was held this morning. Patient also has an upset stomach but did not have a bowel movement yet. I ordered a Fleet enema and a GI cocktail.  Objective: Vitals:   07/17/16 1040 07/17/16 1306  BP: 106/76 118/69  Pulse: (!) 108 98  Resp:  18  Temp:  97.8 F (36.6 C)    Filed Weights   07/14/16 1801 07/15/16 0000  Weight: 124.6 kg (274 lb 11.2 oz) 121.4 kg (267 lb 9.6 oz)    ROS: Review of Systems  Constitutional: Positive for malaise/fatigue. Negative for chills and fever.  Eyes: Negative for blurred vision.  Respiratory: Negative for cough and shortness of breath.   Cardiovascular: Negative for chest pain.  Gastrointestinal: Positive for abdominal pain and constipation. Negative for diarrhea, nausea and vomiting.  Genitourinary: Negative for dysuria.  Musculoskeletal: Negative for joint pain.  Neurological: Negative for dizziness and headaches.   Exam: Physical Exam  Constitutional: He is oriented to person, place, and time.  HENT:  Nose: No mucosal edema.  Mouth/Throat: No oropharyngeal exudate or posterior oropharyngeal edema.  Eyes: Conjunctivae, EOM and lids are normal. Pupils are equal, round, and reactive to light.  Neck: No JVD present. Carotid bruit is not present. No edema present. No thyroid mass and no thyromegaly present.  Cardiovascular: S1 normal and S2 normal.  Tachycardia present.  Exam reveals no gallop.   No murmur heard. Pulses:      Dorsalis pedis pulses are 2+ on the right side, and 2+ on the left side.  Respiratory: No respiratory distress. He  has no wheezes. He has no rhonchi. He has no rales.  GI: Soft. Bowel sounds are normal. There is no tenderness.  Musculoskeletal:       Right knee: He exhibits swelling.       Left knee: He exhibits swelling.       Right ankle: He exhibits swelling.       Left ankle: He exhibits swelling.  Lymphadenopathy:    He has no cervical adenopathy.  Neurological: He is alert and oriented to person, place, and time.  Skin: Skin is warm. Nails show no clubbing.  Right heel small stage II decubiti without signs of infection. As per nurse sacral decubiti stage II without signs of infection. Scaling and dryness bilateral lower extremities  Psychiatric: He has a normal mood and affect.      Data Reviewed: Basic Metabolic Panel:  Recent Labs Lab 07/14/16 1810 07/15/16 0024 07/15/16 0509 07/16/16 0445 07/17/16 0527  NA 136  --  138 136 136  K 3.6  --  3.6 3.5 3.4*  CL 99*  --  104 104 105  CO2 28  --  27 25 25   GLUCOSE 176*  --  150* 100* 99  BUN 51*  --  50* 50* 48*  CREATININE 1.94* 1.94* 1.84* 1.86* 1.98*  CALCIUM 9.2  --  8.7* 8.7* 8.3*   Liver Function Tests:  Recent Labs Lab 07/14/16 1810  AST 34  ALT 24  ALKPHOS 132*  BILITOT 0.7  PROT 7.5  ALBUMIN 3.3*  CBC:  Recent Labs Lab 07/14/16 1810 07/15/16 0509 07/17/16 0527  WBC 9.8 7.6 6.4  NEUTROABS 6.7*  --   --   HGB 13.3 11.6* 10.2*  HCT 39.3* 34.4* 30.2*  MCV 93.2 93.3 94.0  PLT 279 216 183    CBG:  Recent Labs Lab 07/16/16 1714 07/16/16 2056 07/17/16 0731 07/17/16 1151 07/17/16 1652  GLUCAP 161* 170* 89 170* 165*    Recent Results (from the past 240 hour(s))  Culture, blood (Routine x 2)     Status: Abnormal   Collection Time: 07/14/16  6:10 PM  Result Value Ref Range Status   Specimen Description BLOOD BLOOD LEFT HAND  Final   Special Requests   Final    BOTTLES DRAWN AEROBIC AND ANAEROBIC ADEQUATE VOLUME   Culture  Setup Time   Final    GRAM POSITIVE COCCI IN BOTH AEROBIC AND ANAEROBIC  BOTTLES CRITICAL RESULT CALLED TO, READ BACK BY AND VERIFIED WITH: KAREN HAYES ON 07/15/16 AT 1022 [place]    Culture (A)  Final    STAPHYLOCOCCUS SPECIES (COAGULASE NEGATIVE) THE SIGNIFICANCE OF ISOLATING THIS ORGANISM FROM A SINGLE SET OF BLOOD CULTURES WHEN MULTIPLE SETS ARE DRAWN IS UNCERTAIN. PLEASE NOTIFY THE MICROBIOLOGY DEPARTMENT WITHIN ONE WEEK IF SPECIATION AND SENSITIVITIES ARE REQUIRED. Performed at [facility], [place] [address] [place], [place] [address]    Report Status 07/17/2016 FINAL  Final  Culture, blood (Routine x 2)     Status: None (Preliminary result)   Collection Time: 07/14/16  6:10 PM  Result Value Ref Range Status   Specimen Description BLOOD RIGHT ANTECUBITAL  Final   Special Requests   Final    BOTTLES DRAWN AEROBIC AND ANAEROBIC ADEQUATE VOLUME   Culture NO GROWTH 3 DAYS  Final   Report Status PENDING  Incomplete  Blood Culture ID Panel (Reflexed)     Status: Abnormal   Collection Time: 07/14/16  6:10 PM  Result Value Ref Range Status   Enterococcus species NOT DETECTED NOT DETECTED Final   Listeria monocytogenes NOT DETECTED NOT DETECTED Final   Staphylococcus species DETECTED (A) NOT DETECTED Final    Comment: Methicillin (oxacillin) resistant coagulase negative staphylococcus. Possible blood culture contaminant (unless isolated from more than one blood culture draw or clinical case suggests pathogenicity). No antibiotic treatment is indicated for blood  culture contaminants. CRITICAL RESULT CALLED TO, READ BACK BY AND VERIFIED WITH: Charlane Ferretti @ [phone] 07/15/16 by [facility]    Staphylococcus aureus NOT DETECTED NOT DETECTED Final   Methicillin resistance DETECTED (A) NOT DETECTED Final    Comment: CRITICAL RESULT CALLED TO, READ BACK BY AND VERIFIED WITH: Charlane Ferretti @ [phone] 07/15/16 by [facility]    Streptococcus species NOT DETECTED NOT DETECTED Final   Streptococcus agalactiae NOT DETECTED NOT DETECTED Final   Streptococcus pneumoniae NOT DETECTED NOT DETECTED  Final   Streptococcus pyogenes NOT DETECTED NOT DETECTED Final   Acinetobacter baumannii NOT DETECTED NOT DETECTED Final   Enterobacteriaceae species NOT DETECTED NOT DETECTED Final   Enterobacter cloacae complex NOT DETECTED NOT DETECTED Final   Escherichia coli NOT DETECTED NOT DETECTED Final   Klebsiella oxytoca NOT DETECTED NOT DETECTED Final   Klebsiella pneumoniae NOT DETECTED NOT DETECTED Final   Proteus species NOT DETECTED NOT DETECTED Final   Serratia marcescens NOT DETECTED NOT DETECTED Final   Haemophilus influenzae NOT DETECTED NOT DETECTED Final   Neisseria meningitidis NOT DETECTED NOT DETECTED Final   Pseudomonas aeruginosa NOT DETECTED NOT DETECTED Final  Candida albicans NOT DETECTED NOT DETECTED Final   Candida glabrata NOT DETECTED NOT DETECTED Final   Candida krusei NOT DETECTED NOT DETECTED Final   Candida parapsilosis NOT DETECTED NOT DETECTED Final   Candida tropicalis NOT DETECTED NOT DETECTED Final  Urine culture     Status: Abnormal (Preliminary result)   Collection Time: 07/14/16  7:10 PM  Result Value Ref Range Status   Specimen Description URINE, CATHETERIZED  Final   Special Requests NONE  Final   Culture (A)  Final    >=100,000 COLONIES/mL ESCHERICHIA COLI SUSCEPTIBILITIES TO FOLLOW Performed at [facility], [address] [place],  [address]    Report Status PENDING  Incomplete  MRSA PCR Screening     Status: None   Collection Time: 07/15/16  1:40 AM  Result Value Ref Range Status   MRSA by PCR NEGATIVE NEGATIVE Final    Comment:        The GeneXpert MRSA Assay (FDA approved for NASAL specimens only), is one component of a comprehensive MRSA colonization surveillance program. It is not intended to diagnose MRSA infection nor to guide or monitor treatment for MRSA infections.      Studies: No results found.  Scheduled Meds: . feeding supplement (ENSURE ENLIVE)  237 mL Oral BID BM  . gi cocktail  30 mL Oral Once  .  hydrocerin   Topical BID  . insulin aspart  0-5 Units Subcutaneous QHS  . insulin aspart  0-9 Units Subcutaneous TID WC  . insulin glargine  15 Units Subcutaneous q morning - 10a  . metoprolol tartrate  12.5 mg Oral BID  . mometasone-formoterol  2 puff Inhalation BID  . multivitamin with minerals  1 tablet Oral Daily  . piperacillin-tazobactam (ZOSYN)  IV  3.375 g Intravenous Q8H  . polyethylene glycol  17 g Oral Daily  . senna-docusate  1 tablet Oral Daily  . sodium phosphate  1 enema Rectal Once  . [START ON 07/18/2016] warfarin  2 mg Oral Q T,Th,S,Su-1800  . warfarin  3 mg Oral ONCE-1800  . warfarin  4 mg Oral Q M,W,F-1800  . Warfarin - Pharmacist Dosing Inpatient   Does not apply q1800    Assessment/Plan:  1. Clinical sepsis documented by admitting physician. Foley catheter changed in the ER. PatientInitially placed on vancomycin and Zosyn. Blood cultures likely contamination so vancomycin was discontinued. Urine culture growing Escherichia coli. Awaiting sensitivities. 2. Hypotension and tachycardia. Advised the nurse to give the metoprolol that she held this morning because of the low blood pressure. Telemetry monitoring. 3. History of pulmonary embolism on Coumadin. Pharmacy dosing 4. Chronic Foley catheter. Recommend changing catheter every 3 weeks. Stop Flomax with hypotension 5. Dryness of the lower extremity on Eucerin cream 6. Type 2 diabetes mellitus on Lantus and sliding scale 7. History of CVA and being bedbound On Coumadin 8. Stage II sacral decubiti. Local wound care 9. Stage II right heel decubiti visualized by me. Looks like it's healing well. 10. Chronic kidney disease stage III. Continue to monitor 11. Family would like another placement option 12. Patient was accepted to the [place] during but they have not called for transport yet.  Code Status:     Code Status Orders        Start     Ordered   07/14/16 2352  Full code  Continuous     07/14/16 2351    Code  Status History    Date Active Date Inactive Code Status Order  ID Comments User Context   This patient has a current code status but no historical code status.    Advance Directive Documentation     Most Recent Value  Type of Advance Directive  Healthcare Power of Attorney  Pre-existing out of facility DNR order (yellow form or pink MOST form)  -  "MOST" Form in Place?  -     Disposition Plan: transfer to the [place].  Antibiotics:  Zosyn  Time spent: 35 minutes  Loletha Grayer  [employer]

## 2016-07-17 NOTE — Progress Notes (Signed)
ANTICOAGULATION CONSULT NOTE - Follow Up Consult  Pharmacy Consult for warfarin dosing Indication: h/o pulmonary embolus  Allergies  Allergen Reactions  . Allopurinol   . Hctz [Hydrochlorothiazide]   . Oysters [facility] Allergy]     Patient Measurements: Height: 6' 4.8" (195.1 cm) Weight: 267 lb 9.6 oz (121.4 kg) IBW/kg (Calculated) : 88.64 Heparin Dosing Weight:    Vital Signs: Temp: 98.2 F (36.8 C) (03/20 0419) Temp Source: Oral (03/20 0419) BP: 111/65 (03/20 0419) Pulse Rate: 95 (03/20 0419)  Labs:  Recent Labs  07/14/16 1810  07/15/16 0509 07/15/16 0838 07/16/16 0445 07/17/16 0527  HGB 13.3  --  11.6*  --   --  10.2*  HCT 39.3*  --  34.4*  --   --  30.2*  PLT 279  --  216  --   --  183  LABPROT 22.2*  --   --  24.3* 23.9* 22.6*  INR 1.92  --   --  2.14 2.10 1.96  CREATININE 1.94*  < > 1.84*  --  1.86* 1.98*  < > = values in this interval not displayed.  Estimated Creatinine Clearance: 47.1 mL/min (A) (by C-G formula based on SCr of 1.98 mg/dL (H)).   Assessment: Patient admitted for N/V and is anticoagulated w/ warfarin for h/o of PE. Patient takes warfarin: Warfarin 4 mg on MWF Warfarin 2 mg on TThSaSun  3/17  INR 1.92 3/18  INR  2.14 3/19: INR 2.10 3/20: INR: 1.96    Goal of Therapy:  INR 2-3 Monitor platelets by anticoagulation protocol: Yes   Plan:  Will give warfarin 3 mg PO x 1 dose today. Will then continue home regimen. Will follow daily INRs while on antibiotics and continue to monitor CBC   Chayson Charters D 07/17/2016,8:31 AM

## 2016-07-17 NOTE — Clinical Social Work Note (Signed)
CSW was consulted for pt being from a facility and that family did not want pt returning. However, pt would like to be transferred to the [facility]. Pt is ready for discharge to the [facility]. CSW is signing off.  Darden Dates, MSW, LCSW  Clinical Social Worker  [phone]

## 2016-07-17 NOTE — Care Management (Signed)
Received telephone call from Margarita Grizzle at [facility]. May be able to transfer to [facility] today, if vital signs are stable. An  important message from [employer] About your rights sighed. Original given to Mr. Czaplicki.  Shelbie Ammons RN MSN CCM Care Management

## 2016-07-17 NOTE — Progress Notes (Signed)
Patient complaining of discomfort, including heart beating fast and stomach discomfort. Vital signs taken and MD called. Patient denies any chest pain at this time. Dr Metro Kung made aware of patient concerns and complaints. Per Dr. Leslye Peer, patient placed on cardiac monitoring, GI cocktail ordered as well as Fleets Enema to be administered . Per Dr. Leslye Peer- ok to administer PO metoprolol at this time due to fast heart rate.

## 2016-07-18 LAB — CBC
HEMATOCRIT: 30.6 % — AB (ref 40.0–52.0)
Hemoglobin: 10.2 g/dL — ABNORMAL LOW (ref 13.0–18.0)
MCH: 31.2 pg (ref 26.0–34.0)
MCHC: 33.3 g/dL (ref 32.0–36.0)
MCV: 93.8 fL (ref 80.0–100.0)
Platelets: 179 10*3/uL (ref 150–440)
RBC: 3.27 MIL/uL — AB (ref 4.40–5.90)
RDW: 18.1 % — ABNORMAL HIGH (ref 11.5–14.5)
WBC: 6.1 10*3/uL (ref 3.8–10.6)

## 2016-07-18 LAB — BASIC METABOLIC PANEL
ANION GAP: 5 (ref 5–15)
BUN: 45 mg/dL — ABNORMAL HIGH (ref 6–20)
CO2: 25 mmol/L (ref 22–32)
Calcium: 8.5 mg/dL — ABNORMAL LOW (ref 8.9–10.3)
Chloride: 105 mmol/L (ref 101–111)
Creatinine, Ser: 1.89 mg/dL — ABNORMAL HIGH (ref 0.61–1.24)
GFR calc Af Amer: 39 mL/min — ABNORMAL LOW (ref >60)
GFR calc non Af Amer: 33 mL/min — ABNORMAL LOW (ref >60)
GLUCOSE: 155 mg/dL — AB (ref 65–99)
Potassium: 4 mmol/L (ref 3.5–5.1)
Sodium: 135 mmol/L (ref 135–145)

## 2016-07-18 LAB — URINE CULTURE

## 2016-07-18 LAB — PROTIME-INR
INR: 1.69
Prothrombin Time: 20.1 seconds — ABNORMAL HIGH (ref 11.4–15.2)

## 2016-07-18 LAB — GLUCOSE, CAPILLARY
GLUCOSE-CAPILLARY: 136 mg/dL — AB (ref 65–99)
GLUCOSE-CAPILLARY: 133 mg/dL — AB (ref 65–99)
GLUCOSE-CAPILLARY: 154 mg/dL — AB (ref 65–99)
Glucose-Capillary: 167 mg/dL — ABNORMAL HIGH (ref 65–99)
Glucose-Capillary: 118 mg/dL — ABNORMAL HIGH (ref 65–99)

## 2016-07-18 MED ORDER — DEXTROSE 5 % IV SOLN
1.0000 g | INTRAVENOUS | Status: DC
  Administered 2016-07-19 – 2016-07-20 (×2): 1 g via INTRAVENOUS
  Filled 2016-07-18 (×2): qty 10

## 2016-07-18 MED ORDER — WARFARIN SODIUM 1 MG PO TABS
5.0000 mg | ORAL_TABLET | Freq: Once | ORAL | Status: AC
  Administered 2016-07-18: 5 mg via ORAL
  Filled 2016-07-18: qty 5

## 2016-07-18 MED ORDER — SODIUM CHLORIDE 0.9 % IV SOLN: 1.0000 g | INTRAVENOUS | Status: DC

## 2016-07-18 MED ORDER — SODIUM CHLORIDE 0.9 % IV SOLN
1.0000 g | INTRAVENOUS | Status: DC
  Administered 2016-07-18: 12:00:00 1 g via INTRAVENOUS
  Filled 2016-07-18: qty 1

## 2016-07-18 MED ORDER — MAGNESIUM SULFATE 2 GM/50ML IV SOLN
2.0000 g | Freq: Once | INTRAVENOUS | Status: DC
  Filled 2016-07-18: qty 50

## 2016-07-18 MED ORDER — WARFARIN SODIUM 1 MG PO TABS: 4.0000 mg | ORAL_TABLET | ORAL | Status: DC

## 2016-07-18 MED ORDER — DEXTROSE 5 % IV SOLN: 1.0000 g | INTRAVENOUS | Status: DC

## 2016-07-18 NOTE — Progress Notes (Signed)
Patient ID: Carlos Calderon, male   DOB: 12/03/1940, 76 y.o.   MRN: [id number]    [employer] PROGRESS NOTE  Carlos Calderon ELF:[id number] DOB: 07-23-1940 DOA: 07/14/2016 PCP: Alvester Morin, MD  HPI/Subjective: Patient seen this morning. Patient tired but feeling okay. Did not sleep too well last night. Patient was accepted to the [place] but has not gone over to their facility yet.  Objective: Vitals:   07/18/16 0820 07/18/16 1346  BP: 109/67 126/71  Pulse: 99 81  Resp:  20  Temp: 97.9 F (36.6 C) 98.2 F (36.8 C)    Filed Weights   07/14/16 1801 07/15/16 0000  Weight: 124.6 kg (274 lb 11.2 oz) 121.4 kg (267 lb 9.6 oz)    ROS: Review of Systems  Constitutional: Positive for malaise/fatigue. Negative for chills and fever.  Eyes: Negative for blurred vision.  Respiratory: Negative for cough and shortness of breath.   Cardiovascular: Negative for chest pain.  Gastrointestinal: Negative for abdominal pain, constipation, diarrhea, nausea and vomiting.  Genitourinary: Negative for dysuria.  Musculoskeletal: Negative for joint pain.  Neurological: Negative for dizziness and headaches.   Exam: Physical Exam  Constitutional: He is oriented to person, place, and time.  HENT:  Nose: No mucosal edema.  Mouth/Throat: No oropharyngeal exudate or posterior oropharyngeal edema.  Eyes: Conjunctivae, EOM and lids are normal. Pupils are equal, round, and reactive to light.  Neck: No JVD present. Carotid bruit is not present. No edema present. No thyroid mass and no thyromegaly present.  Cardiovascular: S1 normal and S2 normal.  Exam reveals no gallop.   No murmur heard. Pulses:      Dorsalis pedis pulses are 2+ on the right side, and 2+ on the left side.  Respiratory: No respiratory distress. He has no wheezes. He has no rhonchi. He has no rales.  GI: Soft. Bowel sounds are normal. There is no tenderness.  Musculoskeletal:       Right knee: He exhibits swelling.       Left knee:  He exhibits swelling.       Right ankle: He exhibits swelling.       Left ankle: He exhibits swelling.  Lymphadenopathy:    He has no cervical adenopathy.  Neurological: He is alert and oriented to person, place, and time.  Skin: Skin is warm. Nails show no clubbing.  Right heel small stage II decubiti without signs of infection. As per nurse sacral decubiti stage II without signs of infection. Scaling and dryness bilateral lower extremities  Psychiatric: He has a normal mood and affect.      Data Reviewed: Basic Metabolic Panel:  Recent Labs Lab 07/14/16 1810 07/15/16 0024 07/15/16 0509 07/16/16 0445 07/17/16 0527 07/18/16 0636  NA 136  --  138 136 136 135  K 3.6  --  3.6 3.5 3.4* 4.0  CL 99*  --  104 104 105 105  CO2 28  --  27 25 25 25   GLUCOSE 176*  --  150* 100* 99 155*  BUN 51*  --  50* 50* 48* 45*  CREATININE 1.94* 1.94* 1.84* 1.86* 1.98* 1.89*  CALCIUM 9.2  --  8.7* 8.7* 8.3* 8.5*   Liver Function Tests:  Recent Labs Lab 07/14/16 1810  AST 34  ALT 24  ALKPHOS 132*  BILITOT 0.7  PROT 7.5  ALBUMIN 3.3*   CBC:  Recent Labs Lab 07/14/16 1810 07/15/16 0509 07/17/16 0527 07/18/16 0636  WBC 9.8 7.6 6.4 6.1  NEUTROABS 6.7*  --   --   --  HGB 13.3 11.6* 10.2* 10.2*  HCT 39.3* 34.4* 30.2* 30.6*  MCV 93.2 93.3 94.0 93.8  PLT 279 216 183 179    CBG:  Recent Labs Lab 07/17/16 2009 07/17/16 2359 07/18/16 0444 07/18/16 0807 07/18/16 1218  GLUCAP 153* 178* 167* 118* 136*    Recent Results (from the past 240 hour(s))  Culture, blood (Routine x 2)     Status: Abnormal   Collection Time: 07/14/16  6:10 PM  Result Value Ref Range Status   Specimen Description BLOOD BLOOD LEFT HAND  Final   Special Requests   Final    BOTTLES DRAWN AEROBIC AND ANAEROBIC ADEQUATE VOLUME   Culture  Setup Time   Final    GRAM POSITIVE COCCI IN BOTH AEROBIC AND ANAEROBIC BOTTLES CRITICAL RESULT CALLED TO, READ BACK BY AND VERIFIED WITH: KAREN HAYES ON 07/15/16 AT  1022 [place]    Culture (A)  Final    STAPHYLOCOCCUS SPECIES (COAGULASE NEGATIVE) THE SIGNIFICANCE OF ISOLATING THIS ORGANISM FROM A SINGLE SET OF BLOOD CULTURES WHEN MULTIPLE SETS ARE DRAWN IS UNCERTAIN. PLEASE NOTIFY THE MICROBIOLOGY DEPARTMENT WITHIN ONE WEEK IF SPECIATION AND SENSITIVITIES ARE REQUIRED. Performed at [facility], [place] [address] [place], [place] [address]    Report Status 07/17/2016 FINAL  Final  Culture, blood (Routine x 2)     Status: None (Preliminary result)   Collection Time: 07/14/16  6:10 PM  Result Value Ref Range Status   Specimen Description BLOOD RIGHT ANTECUBITAL  Final   Special Requests   Final    BOTTLES DRAWN AEROBIC AND ANAEROBIC ADEQUATE VOLUME   Culture NO GROWTH 4 DAYS  Final   Report Status PENDING  Incomplete  Blood Culture ID Panel (Reflexed)     Status: Abnormal   Collection Time: 07/14/16  6:10 PM  Result Value Ref Range Status   Enterococcus species NOT DETECTED NOT DETECTED Final   Listeria monocytogenes NOT DETECTED NOT DETECTED Final   Staphylococcus species DETECTED (A) NOT DETECTED Final    Comment: Methicillin (oxacillin) resistant coagulase negative staphylococcus. Possible blood culture contaminant (unless isolated from more than one blood culture draw or clinical case suggests pathogenicity). No antibiotic treatment is indicated for blood  culture contaminants. CRITICAL RESULT CALLED TO, READ BACK BY AND VERIFIED WITH: Charlane Ferretti @ [phone] 07/15/16 by [facility]    Staphylococcus aureus NOT DETECTED NOT DETECTED Final   Methicillin resistance DETECTED (A) NOT DETECTED Final    Comment: CRITICAL RESULT CALLED TO, READ BACK BY AND VERIFIED WITH: Charlane Ferretti @ [phone] 07/15/16 by [facility]    Streptococcus species NOT DETECTED NOT DETECTED Final   Streptococcus agalactiae NOT DETECTED NOT DETECTED Final   Streptococcus pneumoniae NOT DETECTED NOT DETECTED Final   Streptococcus pyogenes NOT DETECTED NOT DETECTED Final   Acinetobacter baumannii NOT  DETECTED NOT DETECTED Final   Enterobacteriaceae species NOT DETECTED NOT DETECTED Final   Enterobacter cloacae complex NOT DETECTED NOT DETECTED Final   Escherichia coli NOT DETECTED NOT DETECTED Final   Klebsiella oxytoca NOT DETECTED NOT DETECTED Final   Klebsiella pneumoniae NOT DETECTED NOT DETECTED Final   Proteus species NOT DETECTED NOT DETECTED Final   Serratia marcescens NOT DETECTED NOT DETECTED Final   Haemophilus influenzae NOT DETECTED NOT DETECTED Final   Neisseria meningitidis NOT DETECTED NOT DETECTED Final   Pseudomonas aeruginosa NOT DETECTED NOT DETECTED Final   Candida albicans NOT DETECTED NOT DETECTED Final   Candida glabrata NOT DETECTED NOT DETECTED Final   Candida krusei NOT DETECTED  NOT DETECTED Final   Candida parapsilosis NOT DETECTED NOT DETECTED Final   Candida tropicalis NOT DETECTED NOT DETECTED Final  Urine culture     Status: Abnormal   Collection Time: 07/14/16  7:10 PM  Result Value Ref Range Status   Specimen Description URINE, CATHETERIZED  Final   Special Requests NONE  Final   Culture >=100,000 COLONIES/mL ESCHERICHIA COLI (A)  Final   Report Status 07/18/2016 FINAL  Final   Organism ID, Bacteria ESCHERICHIA COLI (A)  Final      Susceptibility   Escherichia coli - MIC*    AMPICILLIN >=32 RESISTANT Resistant     CEFAZOLIN <=4 SENSITIVE Sensitive     CEFTRIAXONE <=1 SENSITIVE Sensitive     CIPROFLOXACIN >=4 RESISTANT Resistant     GENTAMICIN <=1 SENSITIVE Sensitive     IMIPENEM <=0.25 SENSITIVE Sensitive     NITROFURANTOIN <=16 SENSITIVE Sensitive     TRIMETH/SULFA >=320 RESISTANT Resistant     AMPICILLIN/SULBACTAM >=32 RESISTANT Resistant     PIP/TAZO >=128 RESISTANT Resistant     * >=100,000 COLONIES/mL ESCHERICHIA COLI  MRSA PCR Screening     Status: None   Collection Time: 07/15/16  1:40 AM  Result Value Ref Range Status   MRSA by PCR NEGATIVE NEGATIVE Final    Comment:        The GeneXpert MRSA Assay (FDA approved for NASAL  specimens only), is one component of a comprehensive MRSA colonization surveillance program. It is not intended to diagnose MRSA infection nor to guide or monitor treatment for MRSA infections.      Studies: No results found.  Scheduled Meds: . [START ON 07/19/2016] cefTRIAXone (ROCEPHIN)  IV  1 g Intravenous Q24H  . feeding supplement (ENSURE ENLIVE)  237 mL Oral BID BM  . hydrocerin   Topical BID  . insulin aspart  0-5 Units Subcutaneous QHS  . insulin aspart  0-9 Units Subcutaneous TID WC  . insulin glargine  15 Units Subcutaneous q morning - 10a  . magnesium sulfate 1 - 4 g bolus IVPB  2 g Intravenous Once  . metoprolol tartrate  12.5 mg Oral BID  . mometasone-formoterol  2 puff Inhalation BID  . multivitamin with minerals  1 tablet Oral Daily  . polyethylene glycol  17 g Oral Daily  . senna-docusate  1 tablet Oral Daily  . warfarin  2 mg Oral Q T,Th,S,Su-1800  . [START ON 07/19/2016] warfarin  4 mg Oral Q M,W,F-1800  . warfarin  5 mg Oral ONCE-1800  . Warfarin - Pharmacist Dosing Inpatient   Does not apply q1800    Assessment/Plan:  1. Clinical sepsis documented by admitting physician. Foley catheter changed in the ER. Urine culture growing Escherichia coli which is sensitive to Rocephin. Unfortunately the Zosyn was likely resistant. Patient given a dose of Invanz today. Switch over to Rocephin for tomorrow. Potentially can go home with Keflex. Recommend changing Foley catheter every 3 weeks 2. Hypotension and tachycardia. Blood pressure and heart rate better controlled this afternoon. Continue low-dose metoprolol. I stopped IV fluids. 3. History of pulmonary embolism on Coumadin. Pharmacy dosing 4. Chronic Foley catheter. Recommend changing catheter every 3 weeks. Stop Flomax with hypotension.  5. Dryness of the lower extremity on Eucerin cream 6. Type 2 diabetes mellitus on Lantus and sliding scale 7. History of CVA and being bedbound On Coumadin 8. Stage II sacral  decubiti. Local wound care 9. Stage II right heel decubiti visualized by me. Looks like it's healing well. 10.  Chronic kidney disease stage III. Continue to monitor 11. Family would like another placement option. I will consult our social worker to contact the [place] about placement options. 12. Patient was accepted to the [place] during but they have not called for transport yet.  Patient may not need to be in the hospital too much longer. Patient needs long-term placement. They do not want to be at [facility].  Code Status:     Code Status Orders        Start     Ordered   07/14/16 2352  Full code  Continuous     07/14/16 2351    Code Status History    Date Active Date Inactive Code Status Order ID Comments User Context   This patient has a current code status but no historical code status.    Advance Directive Documentation     Most Recent Value  Type of Advance Directive  Healthcare Power of Attorney  Pre-existing out of facility DNR order (yellow form or pink MOST form)  -  "MOST" Form in Place?  -     Disposition Plan: transfer to the [place].  They don't take the patient over to the [place] they will have to place him in long-term care pets approved by the [place]. We'll have the social worker look into this.  Antibiotics:  Rocephin  Time spent: 35 minutes  Loletha Grayer  [employer]

## 2016-07-18 NOTE — Progress Notes (Signed)
ANTICOAGULATION CONSULT NOTE - Follow Up Consult  Pharmacy Consult for warfarin dosing Indication: h/o pulmonary embolus  Allergies  Allergen Reactions  . Allopurinol   . Hctz [Hydrochlorothiazide]   . Oysters [facility] Allergy]     Patient Measurements: Height: 6' 4.8" (195.1 cm) Weight: 267 lb 9.6 oz (121.4 kg) IBW/kg (Calculated) : 88.64 Heparin Dosing Weight:    Vital Signs: Temp: 97.9 F (36.6 C) (03/21 0820) Temp Source: Oral (03/21 0820) BP: 109/67 (03/21 0820) Pulse Rate: 99 (03/21 0820)  Labs:  Recent Labs  07/16/16 0445 07/17/16 0527 07/18/16 0636  HGB  --  10.2* 10.2*  HCT  --  30.2* 30.6*  PLT  --  183 179  LABPROT 23.9* 22.6* 20.1*  INR 2.10 1.96 1.69  CREATININE 1.86* 1.98* 1.89*    Estimated Creatinine Clearance: 49.3 mL/min (A) (by C-G formula based on SCr of 1.89 mg/dL (H)).   Assessment: Patient admitted for N/V and is anticoagulated w/ warfarin for h/o of PE. Patient takes warfarin: Warfarin 4 mg on MWF Warfarin 2 mg on TThSaSun  3/17  INR 1.92 3/18  INR  2.14 3/19: INR 2.10 3/20: INR: 1.96; 3 mg  3/21: INR: 1.69; 5 mg    Goal of Therapy:  INR 2-3 Monitor platelets by anticoagulation protocol: Yes   Plan:  Will give warfarin 5 mg PO x 1 dose today. Will then continue home regimen. Will follow daily INRs while on antibiotics and continue to monitor CBC   Redding Cloe D 07/18/2016,8:24 AM

## 2016-07-19 DIAGNOSIS — Z86711 Personal history of pulmonary embolism: Secondary | ICD-10-CM | POA: Diagnosis present

## 2016-07-19 LAB — CULTURE, BLOOD (ROUTINE X 2)
Culture: NO GROWTH
SPECIAL REQUESTS: ADEQUATE

## 2016-07-19 LAB — GLUCOSE, CAPILLARY
GLUCOSE-CAPILLARY: 116 mg/dL — AB (ref 65–99)
Glucose-Capillary: 112 mg/dL — ABNORMAL HIGH (ref 65–99)
Glucose-Capillary: 137 mg/dL — ABNORMAL HIGH (ref 65–99)
Glucose-Capillary: 104 mg/dL — ABNORMAL HIGH (ref 65–99)

## 2016-07-19 LAB — PROTIME-INR
INR: 1.64
PROTHROMBIN TIME: 19.6 s — AB (ref 11.4–15.2)

## 2016-07-19 MED ORDER — ENOXAPARIN SODIUM 120 MG/0.8ML ~~LOC~~ SOLN
1.0000 mg/kg | Freq: Two times a day (BID) | SUBCUTANEOUS | Status: DC
  Administered 2016-07-19 – 2016-07-20 (×3): 120 mg via SUBCUTANEOUS
  Filled 2016-07-19 (×4): qty 1.6

## 2016-07-19 MED ORDER — WARFARIN SODIUM 1 MG PO TABS
4.0000 mg | ORAL_TABLET | Freq: Once | ORAL | Status: AC
  Administered 2016-07-19: 4 mg via ORAL
  Filled 2016-07-19: qty 4

## 2016-07-19 MED ORDER — WARFARIN SODIUM 1 MG PO TABS: 2.0000 mg | ORAL_TABLET | ORAL | Status: DC

## 2016-07-19 MED ORDER — CEPHALEXIN 500 MG PO CAPS: 500.0000 mg | ORAL_CAPSULE | Freq: Three times a day (TID) | ORAL | 0 refills | Status: AC

## 2016-07-19 NOTE — Care Management (Signed)
Received telephone call from Carlos Calderon at [facility]. Unable to accept Carlos Calderon. [place] Worker updated Carlos Ammons RN MSN CCM Care Management

## 2016-07-19 NOTE — Progress Notes (Addendum)
ANTICOAGULATION CONSULT NOTE - Follow Up Consult  Pharmacy Consult for warfarin dosing Indication: h/o pulmonary embolus  Allergies  Allergen Reactions  . Allopurinol   . Hctz [Hydrochlorothiazide]   . Oysters [facility] Allergy]     Patient Measurements: Height: 6' 4.8" (195.1 cm) Weight: 267 lb 9.6 oz (121.4 kg) IBW/kg (Calculated) : 88.64 Heparin Dosing Weight:    Vital Signs: Temp: 98.5 F (36.9 C) (03/22 0457) Temp Source: Oral (03/22 0457) BP: 126/72 (03/22 0457) Pulse Rate: 95 (03/22 0457)  Labs:  Recent Labs  07/17/16 0527 07/18/16 0636 07/19/16 0712  HGB 10.2* 10.2*  --   HCT 30.2* 30.6*  --   PLT 183 179  --   LABPROT 22.6* 20.1* 19.6*  INR 1.96 1.69 1.64  CREATININE 1.98* 1.89*  --     Estimated Creatinine Clearance: 48.6 mL/min (A) (by C-G formula based on SCr of 1.89 mg/dL (H)).   Assessment: Patient admitted for N/V and is anticoagulated w/ warfarin for h/o of PE. Patient takes warfarin: Warfarin 4 mg on MWF Warfarin 2 mg on TThSaSun  3/17  INR 1.92 3/18  INR  2.14 3/19: INR 2.10 3/20: INR: 1.96; 3 mg  3/21: INR: 1.69; 5 mg  3/22: INR: 1.64    Goal of Therapy:  INR 2-3 Monitor platelets by anticoagulation protocol: Yes   Plan:  INR subtherapeutic, Will give warfarin 4 mg PO x 1 dose today.Spoke with MD Mody about possible bridging since not sure when patient had PE. MD would like to start Enoxaparin for now and she will reassess. Order placed for Lovenox 120 mg SQ q12 hours.   Will follow daily INRs while on antibiotics and continue to monitor CBC   Ethyl Vila D 07/19/2016,8:30 AM

## 2016-07-19 NOTE — Clinical Social Work Note (Signed)
LATE ENTRY  CSW spoke to pt's granddaughter on 07/16/2016 as she called the floor requesting to speak with CSW. Pt's granddaughter does not want to have pt return to [facility], the facility that he was admitted from and would like another SNF. Pt's granddaughter is open to transfer to [facility]. CSW updated RNCM. Full assessment follow if appropriate. CSW will continue to follow.   Darden Dates, MSW, LCSW  Clinical Social Worker  [phone]

## 2016-07-19 NOTE — Clinical Social Work Note (Signed)
Clinical Social Work Assessment  Patient Details  Name: Carlos Calderon MRN: [id number] Date of Birth: February 22, 1941  Date of referral:  07/19/16               Reason for consult:  Facility Placement                Permission sought to share information with:  Family Supports Permission granted to share information::  Yes, Verbal Permission Granted  Name::     Scott Vanderveer  Relationship::  granddaughter, Administrator, arts Information:  [phone]  Housing/Transportation Living arrangements for the past 2 months:  [place] of Information:  Patient, Power of Attorney Patient Interpreter Needed:  None Criminal Activity/Legal Involvement Pertinent to Current Situation/Hospitalization:  No - Comment as needed Significant Relationships:   Adult Children, Other family members Lives with:  Facility Resident Do you feel safe going back to the place where you live?  No Need for family participation in patient care:  Yes (Comment)  Care giving concerns:  Pt is in need of LTC.   Social Worker assessment / plan:  CSW met with pt to address consult. CSW introduced herself and explained role of social work. CSW also explained process of discharging to another facility. CSW was informed that the [facility] is not able to accept pt. CSW shared that she would be calling his granddaughter. CSW spoke with granddaughter to provide update. CSW confirmed that pt is in need of LTC. CSW spoke to admission coordinator at [facility] in [place], and there are no LTC bed available with a [facility] contract only [facility]. CSW confirmed that pt is in need of LTC. Pt's granddaughter is unable to take pt home safely. LTC facilities that accept that have [place] contracts are limited. Pt may have to return to [facility], even though the family does not want pt to return and cleaned out his room. CSW also has a phone call into Education officer, museum at the [place] to address options, and to [facility] about possible return. CSW  will continue to follow.   Employment status:  Retired Forensic scientist:  [employer] PT Recommendations:  Not assessed at this time Information / Referral to community resources:  Other (Comment Required)  Patient/Family's Response to care:  Pt's granddaughter was appreciative of CSW support.   Patient/Family's Understanding of and Emotional Response to Diagnosis, Current Treatment, and Prognosis:  Pt is need of LTC.   Emotional Assessment Appearance:  Appears stated age Attitude/Demeanor/Rapport:  Other (Appropriate) Affect (typically observed):  Pleasant Orientation:  Oriented to Self, Oriented to Place, Oriented to  Time, Oriented to Situation Alcohol / Substance use:  Not Applicable Psych involvement (Current and /or in the community):  No (Comment)  Discharge Needs  Concerns to be addressed:  Adjustment to Illness Readmission within the last 30 days:  No Current discharge risk:  Chronically ill Barriers to Discharge:  Continued Medical Work up   [employer], LCSW 07/19/2016, 11:48 AM

## 2016-07-19 NOTE — Discharge Summary (Addendum)
[place] at [place] NAME: Carlos Calderon    MR#:  [id number]  DATE OF BIRTH:  20-Jan-1941  DATE OF ADMISSION:  07/14/2016 ADMITTING PHYSICIAN: Quintella Baton, MD  DATE OF DISCHARGE: 07/20/2016  PRIMARY CARE PHYSICIAN: Alvester Morin, MD    ADMISSION DIAGNOSIS:  Malaise [R53.81] Tachycardia [R00.0] Elevated lactic acid level [R79.89]  DISCHARGE DIAGNOSIS:  Principal Problem:   Sepsis secondary to UTI [facility]) Active Problems:   Hypotension   Type 1 diabetes mellitus without complication (HCC)   BPH with urinary obstruction   Chronic anticoagulation   UTI (urinary tract infection)   Pressure injury of skin   Personal history of PE (pulmonary embolism)   SECONDARY DIAGNOSIS:   Past Medical History:  Diagnosis Date  . Acute renal failure syndrome ([name])   . Atopic rhinitis   . Benign enlargement of prostate   . 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 Chronic kidney disease, stage III (moderate)   . CN (constipation)   . Coronary atherosclerosis of native coronary artery   . Diverticulosis of large intestine without diverticulitis   . Encounter for long-term (current) use of non-steroidal anti-inflammatories   . Esophageal reflux   . Essential hypertension, malignant   . Gout, joint   . Hyperlipemia   . 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 Left paraspinal back pain   . Muscle weakness (generalized)   . Neurogenic dysfunction of the urinary bladder   . Normocytic hypochromic anemia   . Obstructive chronic bronchitis without exacerbation ([name])   . Personal history of PE (pulmonary embolism)   . Personal history of transient cerebral ischemia   . Posttraumatic stress disorder   . Pure hypercholesterolemia   . Severe recurrent major depression without psychotic features ([name])   . Type 2 diabetes mellitus with end-stage renal disease [facility])     HOSPITAL COURSE:   76y/o male with PMHx of  Diabetes, and CKD stage 3 admitted with sepsis.  1. Clinical sepsis documented by admitting physician. Foley catheter was changed in the ER. Urine culture growing Escherichia coli which is sensitive to Rocephin. He will discharged on Keflex and it is recommend changing Foley catheter every 3 weeks   2. Hypotension and tachycardia. Blood pressure and heart rate better controlled. Continue low-dose metoprolol   3. Remote History of pulmonary embolism on Coumadin.    4 Chronic Foley catheter. Recommend changing catheter every 3 weeks. Continue Flomax.  5 Type 2 diabetes mellitus: He will continue ADA diet and Lantus. 6  History of CVA and being bedbound He is on coumadin. INR should be checked in the next few days. Goal 2-3 INR.  7  Stage II sacral decubiti, POA: . Local wound care and repositioning to avoid the supine position, pressure redistribution heel boots, a pressure redistribution chair pad while OOB.   7.Chronic kidney disease stage III. Continue to monitor  He was medically stable for discharge before transfer to [place] could happen. I spoke with MD at [facility] who will assist with appointment for Optho since patient has lost his glasses.  DISCHARGE CONDITIONS AND DIET:   Stable Heart healthy diet  CONSULTS OBTAINED:    DRUG ALLERGIES:   Allergies  Allergen Reactions  . Allopurinol   . Hctz [Hydrochlorothiazide]   . Oysters [Shellfish Allergy]     DISCHARGE MEDICATIONS:   Current Discharge Medication List    START taking these medications   Details  cephALEXin (KEFLEX) 500  MG capsule Take 1 capsule (500 mg total) by mouth 3 (three) times daily. Qty: 24 capsule, Refills: 0    feeding supplement, ENSURE ENLIVE, (ENSURE ENLIVE) LIQD Take 237 mLs by mouth 2 (two) times daily between meals. Qty: 237 mL, Refills: 12    metoprolol tartrate (LOPRESSOR) 25 MG tablet Take 0.5 tablets (12.5 mg total) by mouth 2 (two) times daily.      CONTINUE these medications which have  NOT CHANGED   Details  acetaminophen (TYLENOL) 325 MG tablet Take 650 mg by mouth every 4 (four) hours as needed.    atorvastatin (LIPITOR) 40 MG tablet Take 40 mg by mouth at bedtime.    budesonide-formoterol (SYMBICORT) 160-4.5 MCG/ACT inhaler Inhale 2 puffs into the lungs 2 (two) times daily.    Cranberry 450 MG CAPS Take 1 capsule by mouth daily.    insulin glargine (LANTUS) 100 UNIT/ML injection Inject 15 Units into the skin every morning.    Multiple Vitamin (MULTIVITAMIN) tablet Take 1 tablet by mouth daily.    nicotine (NICODERM CQ - DOSED IN MG/24 HOURS) 14 mg/24hr patch Place 14 mg onto the skin daily.    sennosides-docusate sodium (SENOKOT-S) 8.6-50 MG tablet Take 1 tablet by mouth daily.    tamsulosin (FLOMAX) 0.4 MG CAPS capsule Take 0.4 mg by mouth daily.    umeclidinium bromide (INCRUSE ELLIPTA) 62.5 MCG/INH AEPB Inhale 1 puff into the lungs daily.    urea (CARMOL) 20 % cream Apply 1 application topically 2 (two) times daily.    !! warfarin (COUMADIN) 2 MG tablet Take 2 mg by mouth See admin instructions. Tuesday, Thursday, Saturday, and Sunday    !! warfarin (COUMADIN) 4 MG tablet Take 4 mg by mouth See admin instructions. Monday, Wednesday, and Friday     !! - Potential duplicate medications found. Please discuss with provider.    STOP taking these medications     metoprolol succinate (TOPROL-XL) 100 MG 24 hr tablet      torsemide (DEMADEX) 20 MG tablet           Today   CHIEF COMPLAINT:  No issues overnight Did not sleep to well last night.   VITAL SIGNS:  Blood pressure 135/70, pulse 76, temperature 97.9 F (36.6 C), temperature source Oral, resp. rate 18, height 6' 4.8" (1.951 m), weight 121.4 kg (267 lb 9.6 oz), SpO2 100 %.   REVIEW OF SYSTEMS:  Review of Systems  Constitutional: Negative.  Negative for chills, fever and malaise/fatigue.  HENT: Negative.  Negative for ear discharge, ear pain, hearing loss, nosebleeds and sore throat.    Eyes: Negative.  Negative for blurred vision and pain.  Respiratory: Negative.  Negative for cough, hemoptysis, shortness of breath and wheezing.   Cardiovascular: Negative.  Negative for chest pain, palpitations and leg swelling.  Gastrointestinal: Negative.  Negative for abdominal pain, blood in stool, diarrhea, nausea and vomiting.  Genitourinary: Negative.  Negative for dysuria.  Musculoskeletal: Negative.  Negative for back pain.  Skin: Negative.   Neurological: Negative for dizziness, tremors, speech change, focal weakness, seizures and headaches.  Endo/Heme/Allergies: Negative.  Does not bruise/bleed easily.  Psychiatric/Behavioral: Negative.  Negative for depression, hallucinations and suicidal ideas.     PHYSICAL EXAMINATION:  GENERAL:  76 y.o.-year-old patient lying in the bed with no acute distress.  NECK:  Supple, no jugular venous distention. No thyroid enlargement, no tenderness.  LUNGS: Normal breath sounds bilaterally, no wheezing, rales,rhonchi  No use of accessory muscles of respiration.  CARDIOVASCULAR: S1, S2  normal. No murmurs, rubs, or gallops.  ABDOMEN: Soft, non-tender, non-distended. Bowel sounds present. No organomegaly or mass.  EXTREMITIES: No pedal edema, cyanosis, or clubbing.  PSYCHIATRIC: The patient is alert and oriented x 3.  SKIN: No obvious rash, lesion, or ulcer.   DATA REVIEW:   CBC  Recent Labs Lab 07/20/16 0455  WBC 5.5  HGB 11.2*  HCT 33.2*  PLT 188    Chemistries   Recent Labs Lab 07/14/16 1810  07/18/16 0636 07/20/16 0455  NA 136  < > 135  --   K 3.6  < > 4.0  --   CL 99*  < > 105  --   CO2 28  < > 25  --   GLUCOSE 176*  < > 155*  --   BUN 51*  < > 45*  --   CREATININE 1.94*  < > 1.89* 1.67*  CALCIUM 9.2  < > 8.5*  --   AST 34  --   --   --   ALT 24  --   --   --   ALKPHOS 132*  --   --   --   BILITOT 0.7  --   --   --   < > = values in this interval not displayed.  Cardiac Enzymes No results for input(s):  TROPONINI in the last 168 hours.  Microbiology Results  @MICRORSLT48 @  RADIOLOGY:  No results found.    Current Discharge Medication List    START taking these medications   Details  cephALEXin (KEFLEX) 500 MG capsule Take 1 capsule (500 mg total) by mouth 3 (three) times daily. Qty: 24 capsule, Refills: 0    feeding supplement, ENSURE ENLIVE, (ENSURE ENLIVE) LIQD Take 237 mLs by mouth 2 (two) times daily between meals. Qty: 237 mL, Refills: 12    metoprolol tartrate (LOPRESSOR) 25 MG tablet Take 0.5 tablets (12.5 mg total) by mouth 2 (two) times daily.      CONTINUE these medications which have NOT CHANGED   Details  acetaminophen (TYLENOL) 325 MG tablet Take 650 mg by mouth every 4 (four) hours as needed.    atorvastatin (LIPITOR) 40 MG tablet Take 40 mg by mouth at bedtime.    budesonide-formoterol (SYMBICORT) 160-4.5 MCG/ACT inhaler Inhale 2 puffs into the lungs 2 (two) times daily.    Cranberry 450 MG CAPS Take 1 capsule by mouth daily.    insulin glargine (LANTUS) 100 UNIT/ML injection Inject 15 Units into the skin every morning.    Multiple Vitamin (MULTIVITAMIN) tablet Take 1 tablet by mouth daily.    nicotine (NICODERM CQ - DOSED IN MG/24 HOURS) 14 mg/24hr patch Place 14 mg onto the skin daily.    sennosides-docusate sodium (SENOKOT-S) 8.6-50 MG tablet Take 1 tablet by mouth daily.    tamsulosin (FLOMAX) 0.4 MG CAPS capsule Take 0.4 mg by mouth daily.    umeclidinium bromide (INCRUSE ELLIPTA) 62.5 MCG/INH AEPB Inhale 1 puff into the lungs daily.    urea (CARMOL) 20 % cream Apply 1 application topically 2 (two) times daily.    !! warfarin (COUMADIN) 2 MG tablet Take 2 mg by mouth See admin instructions. Tuesday, Thursday, Saturday, and Sunday    !! warfarin (COUMADIN) 4 MG tablet Take 4 mg by mouth See admin instructions. Monday, Wednesday, and Friday     !! - Potential duplicate medications found. Please discuss with provider.    STOP taking these  medications     metoprolol succinate (TOPROL-XL) 100 MG 24  hr tablet      torsemide (DEMADEX) 20 MG tablet          Management plans discussed with the patient and he is in agreement. Stable for discharge SNF  Patient should follow up with pcp  CODE STATUS:     Code Status Orders        Start     Ordered   07/14/16 2352  Full code  Continuous     07/14/16 2351    Code Status History    Date Active Date Inactive Code Status Order ID Comments User Context   This patient has a current code status but no historical code status.    Advance Directive Documentation     Most Recent Value  Type of Advance Directive  Healthcare Power of Attorney  Pre-existing out of facility DNR order (yellow form or pink MOST form)  -  "MOST" Form in Place?  -      TOTAL TIME TAKING CARE OF THIS PATIENT: 37 minutes.    Note: This dictation was prepared with Dragon dictation along with smaller phrase technology. Any transcriptional errors that result from this process are unintentional.  Fouad Taul M.D on 07/20/2016 at 8:08 AM  Between 7am to 6pm - Pager - [phone] After 6pm go to [email or web address] - password [email or web address] [facility]  Office  [phone]  CC: Primary care physician; Alvester Morin, MD

## 2016-07-19 NOTE — Progress Notes (Signed)
[place] at [place] NAME: Carlos Calderon    MR#:  [id number]  DATE OF BIRTH:  [birth year]  SUBJECTIVE:  Doing well this am No acute issues overnight  REVIEW OF SYSTEMS:    Review of Systems  Constitutional: Negative.  Negative for chills, fever and malaise/fatigue.  HENT: Negative.  Negative for ear discharge, ear pain, hearing loss, nosebleeds and sore throat.   Eyes: Negative.  Negative for blurred vision and pain.  Respiratory: Negative.  Negative for cough, hemoptysis, shortness of breath and wheezing.   Cardiovascular: Negative.  Negative for chest pain, palpitations and leg swelling.  Gastrointestinal: Negative.  Negative for abdominal pain, blood in stool, diarrhea, nausea and vomiting.  Genitourinary: Negative.  Negative for dysuria.  Musculoskeletal: Negative.  Negative for back pain.  Skin: Negative.   Neurological: Negative for dizziness, tremors, speech change, focal weakness, seizures and headaches.  Endo/Heme/Allergies: Negative.  Does not bruise/bleed easily.  Psychiatric/Behavioral: Negative.  Negative for depression, hallucinations and suicidal ideas.    Tolerating Diet:yes      DRUG ALLERGIES:   Allergies  Allergen Reactions  . Allopurinol   . Hctz [Hydrochlorothiazide]   . Oysters [Shellfish Allergy]     VITALS:  Blood pressure 132/78, pulse 90, temperature 98.5 F (36.9 C), temperature source Oral, resp. rate 16, height 6' 4.8" (1.951 m), weight 121.4 kg (267 lb 9.6 oz), SpO2 100 %.  PHYSICAL EXAMINATION:   Physical Exam  Constitutional: He is oriented to person, place, and time and well-developed, well-nourished, and in no distress. No distress.  HENT:  Head: Normocephalic.  Eyes: No scleral icterus.  Neck: Normal range of motion. Neck supple. No JVD present. No tracheal deviation present.  Cardiovascular: Normal rate, regular rhythm and normal heart sounds.  Exam reveals no gallop and no friction rub.    No murmur heard. Pulmonary/Chest: Effort normal and breath sounds normal. No respiratory distress. He has no wheezes. He has no rales. He exhibits no tenderness.  Abdominal: Soft. Bowel sounds are normal. He exhibits no distension and no mass. There is no tenderness. There is no rebound and no guarding.  Musculoskeletal: Normal range of motion. He exhibits no edema.  Neurological: He is alert and oriented to person, place, and time.  Skin: Skin is warm. No rash noted. No erythema.  Psychiatric: Affect and judgment normal.      LABORATORY PANEL:   CBC  Recent Labs Lab 07/18/16 0636  WBC 6.1  HGB 10.2*  HCT 30.6*  PLT 179   ------------------------------------------------------------------------------------------------------------------  Chemistries   Recent Labs Lab 07/14/16 1810  07/18/16 0636  NA 136  < > 135  K 3.6  < > 4.0  CL 99*  < > 105  CO2 28  < > 25  GLUCOSE 176*  < > 155*  BUN 51*  < > 45*  CREATININE 1.94*  < > 1.89*  CALCIUM 9.2  < > 8.5*  AST 34  --   --   ALT 24  --   --   ALKPHOS 132*  --   --   BILITOT 0.7  --   --   < > = values in this interval not displayed. ------------------------------------------------------------------------------------------------------------------  Cardiac Enzymes No results for input(s): TROPONINI in the last 168 hours. ------------------------------------------------------------------------------------------------------------------  RADIOLOGY:  No results found.   ASSESSMENT AND PLAN:   76y/o male with PMHx of Diabetes, and CKD stage 3 admitted with sepsis 1. Clinical sepsis documented by admitting physician. Foley  catheter changed in the ER. Urine culture growing Escherichia coli which is sensitive to Rocephin. He will discharged on Keflex and Recommend changing Foley catheter every 3 weeks 2. Hypotension and tachycardia. Blood pressure and heart rate better controlled. Continue low-dose metoprolol.History of  pulmonary embolism on Coumadin. Pharmacy dosing 3. Chronic Foley catheter. Recommend changing catheter every 3 weeks. Continue Flomax.  4. Type 2 diabetes mellitus: He will continue ADA diet and Lantus. 5.  History of CVA and being bedbound He is on coumadin. INR should be checked in the next few days.  6. Stage II sacral decubiti, POA: . Local wound care  and repositioning to avoid the supine position, pressure redistribution heel boots, a pressure redistribution chair pad while OOB.   7.Chronic kidney disease stage III. Continue to monitor  Ready for d/c but family does not want patient to go back to [place], it seems. Does not need transfer to [place] he is medically stable for discharge.   D/c tele     Management plans discussed with the patient and he is in agreement.  CODE STATUS: full  TOTAL TIME TAKING CARE OF THIS PATIENT: 26 minutes.     POSSIBLE D/C tomorrow, DEPENDING ON CLINICAL CONDITION.   Vibha Ferdig M.D on 07/19/2016 at 5:16 PM  Between 7am to 6pm - Pager - [phone] After 6pm go to [email or web address] - password [email or web address] [facility]  Office  [phone]  CC: Primary care physician; Alvester Morin, MD  Note: This dictation was prepared with Dragon dictation along with smaller phrase technology. Any transcriptional errors that result from this process are unintentional.

## 2016-07-20 LAB — PROTIME-INR
INR: 1.72
Prothrombin Time: 20.4 seconds — ABNORMAL HIGH (ref 11.4–15.2)

## 2016-07-20 LAB — CREATININE, SERUM
Creatinine, Ser: 1.67 mg/dL — ABNORMAL HIGH (ref 0.61–1.24)
GFR calc Af Amer: 45 mL/min — ABNORMAL LOW (ref >60)
GFR calc non Af Amer: 38 mL/min — ABNORMAL LOW (ref >60)

## 2016-07-20 LAB — CBC
HEMATOCRIT: 33.2 % — AB (ref 40.0–52.0)
Hemoglobin: 11.2 g/dL — ABNORMAL LOW (ref 13.0–18.0)
MCH: 32.1 pg (ref 26.0–34.0)
MCHC: 33.8 g/dL (ref 32.0–36.0)
MCV: 95 fL (ref 80.0–100.0)
Platelets: 188 10*3/uL (ref 150–440)
RBC: 3.49 MIL/uL — ABNORMAL LOW (ref 4.40–5.90)
RDW: 18.6 % — AB (ref 11.5–14.5)
WBC: 5.5 10*3/uL (ref 3.8–10.6)

## 2016-07-20 LAB — GLUCOSE, CAPILLARY
GLUCOSE-CAPILLARY: 78 mg/dL (ref 65–99)
GLUCOSE-CAPILLARY: 144 mg/dL — AB (ref 65–99)

## 2016-07-20 NOTE — Clinical Social Work Placement (Signed)
   CLINICAL SOCIAL WORK PLACEMENT  NOTE  Date:  07/20/2016  Patient Details  Name: Carlos Calderon MRN: [id number] Date of Birth: 02-06-1941  Clinical Social Work is seeking post-discharge placement for this patient at the [place] level of care (*CSW will initial, date and re-position this form in  chart as items are completed):  Yes   Patient/family provided with [place] Work Department's list of facilities offering this level of care within the geographic area requested by the patient (or if unable, by the patient's family).  Yes   Patient/family informed of their freedom to choose among providers that offer the needed level of care, that participate in Medicare, Medicaid or managed care program needed by the patient, have an available bed and are willing to accept the patient.  Yes   Patient/family informed of [facility] ownership interest in [facility] and [facility], as well as of the fact that they are under no obligation to receive care at these facilities.  PASRR submitted to EDS on       PASRR number received on       Existing PASRR number confirmed on 07/17/16     FL2 transmitted to all facilities in geographic area requested by pt/family on 07/20/16     FL2 transmitted to all facilities within larger geographic area on       Patient informed that his/her managed care company has contracts with or will negotiate with certain facilities, including the following:        Yes   Patient/family informed of bed offers received.  Patient chooses bed at  ([facility] )     Physician recommends and patient chooses bed at      Patient to be transferred to  ([facility] ) on 07/20/16.  Patient to be transferred to facility by  [facility] )     Patient family notified on 07/20/16 of transfer.  Name of family member notified:   (Patient's granddaughter Patric Dykes is aware of D/C today. )     PHYSICIAN       Additional Comment:     _______________________________________________ Jolynn Bajorek, Veronia Beets, LCSW 07/20/2016, 11:26 AM

## 2016-07-20 NOTE — Care Management Important Message (Signed)
Important Message  Patient Details  Name: Carlos Calderon MRN: [id number] Date of Birth: Oct 03, 1940   Medicare Important Message Given:  Yes    Shelbie Ammons, RN 07/20/2016, 8:33 AM

## 2016-07-20 NOTE — Progress Notes (Signed)
ANTICOAGULATION CONSULT NOTE - Follow Up Consult  Pharmacy Consult for warfarin dosing Indication: h/o pulmonary embolus  Allergies  Allergen Reactions  . Allopurinol   . Hctz [Hydrochlorothiazide]   . [facility] Allergy]     Patient Measurements: Height: 6' 4.8" (195.1 cm) Weight: 267 lb 9.6 oz (121.4 kg) IBW/kg (Calculated) : 88.64 Heparin Dosing Weight:    Vital Signs: Temp: 98 F (36.7 C) (03/23 1256) Temp Source: Oral (03/23 1256) BP: 135/74 (03/23 1256) Pulse Rate: 80 (03/23 1256)  Labs:  Recent Labs  07/18/16 0636 07/19/16 0712 07/20/16 0455  HGB 10.2*  --  11.2*  HCT 30.6*  --  33.2*  PLT 179  --  188  LABPROT 20.1* 19.6* 20.4*  INR 1.69 1.64 1.72  CREATININE 1.89*  --  1.67*    Estimated Creatinine Clearance: 55 mL/min (A) (by C-G formula based on SCr of 1.67 mg/dL (H)).   Assessment: Patient admitted for N/V and is anticoagulated w/ warfarin for h/o of PE. Patient takes warfarin: Warfarin 4 mg on MWF Warfarin 2 mg on TThSaSun  3/17  INR 1.92 3/18  INR  2.14 3/19: INR 2.10 3/20: INR: 1.96; 3 mg  3/21: INR: 1.69; 5 mg  3/22: INR: 1.64; 4 mg  3/23: INR: 1.72; 4 mg    Goal of Therapy:  INR 2-3 Monitor platelets by anticoagulation protocol: Yes   Plan:  INR subtherapeutic, Will give warfarin 4 mg PO x 1 dose today. Patient also currently on  Lovenox 120 mg SQ q12 hours.   Will follow daily INRs while on antibiotics and continue to monitor CBC   Austan Nicholl D 07/20/2016,1:28 PM

## 2016-07-20 NOTE — Progress Notes (Signed)
Per [facility] admissions coordinator at [facility] they cannot accept patient back and have filled his bed. Clinical Education officer, museum (CSW) contacted the [employer] and spoke to [employer] with the [facility] [employer] [facility] nursing home program. Per Melissa patient is highly service connected and qualifies for long term care SNF under the [place]. Per Melissa patient was placed at [facility] on 05/09/16 under [facility] benefit for short term rehab. Per Melissa patient has not used any of his medicare benefit and is able to use that if he wants to. Melissa emailed CSW a list of [facility] contracted facilities in [place] and [place].   CSW contacted patient's granddaughter Patric Dykes and made her aware of above. CSW explained that patient can be placed today at a local SNF under medicare. Patient has met the 3 night inpatient stay criteria and was admitted to inpatient on 07/14/16. Patric Dykes is agreeable to place patient at a local SNF today and follow up with the [facility] about a contracted facility closer to her home. FL2 complete and faxed out.   CSW presented bed offers to granddaughter and she chose [facility]. Per [facility] liaison patient can come today to room 805. RN will call report to [address] nurse and arrange EMS for transport. CSW sent D/C orders to [facility] via [place]. Patient is aware of above. Patric Dykes is aware of above. CSW emailed Patric Dykes the list of contracted [place] nursing homes. Melissa at the [facility] is also aware of plan. Please reconsult if future social work needs arise. CSW signing off.   [employer], LCSW [phone]

## 2016-07-20 NOTE — Progress Notes (Signed)
Patient discharged to [facility] per MD order. Report called to [place] LPN at facility. EMS called for transportation.

## 2016-07-24 ENCOUNTER — Other Ambulatory Visit
Admission: RE | Admit: 2016-07-24 | Discharge: 2016-07-24 | Disposition: A | Discharge status: Home / Self Care | Payer: No Typology Code available for payment source | Source: Other Acute Inpatient Hospital | Attending: Family Medicine | Admitting: Family Medicine

## 2016-07-24 DIAGNOSIS — Z86711 Personal history of pulmonary embolism: Secondary | ICD-10-CM | POA: Insufficient documentation

## 2016-07-24 LAB — PROTIME-INR
INR: 1.19
PROTHROMBIN TIME: 15.2 s (ref 11.4–15.2)

## 2017-10-12 IMAGING — DX DG CHEST 1V PORT
1 series · 2 of 2 positions shown · non-contrast
Comparison: None.

CLINICAL DATA: 75-year-old male with sepsis

EXAM:
PORTABLE CHEST 1 VIEW

[Series 1: chest ap · 0.14mm/px · 2 of 2 slices shown]
[im 1/2]
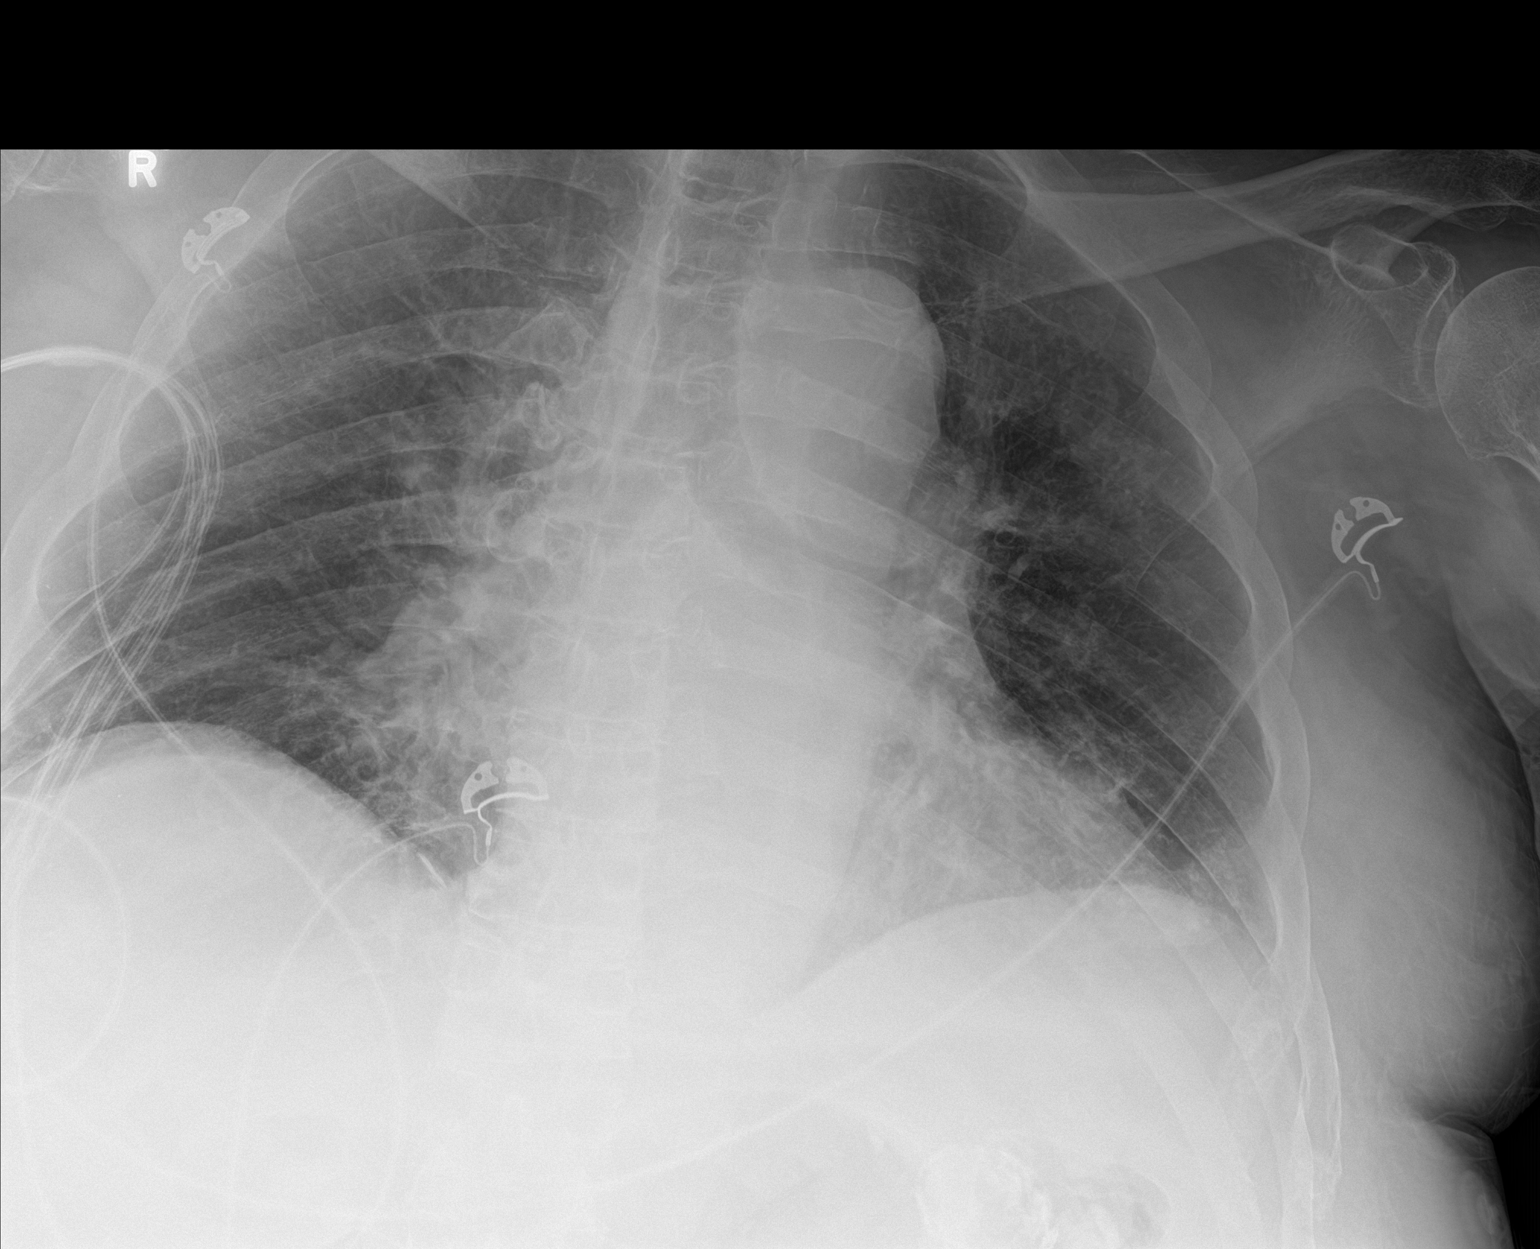
[im 2/2]
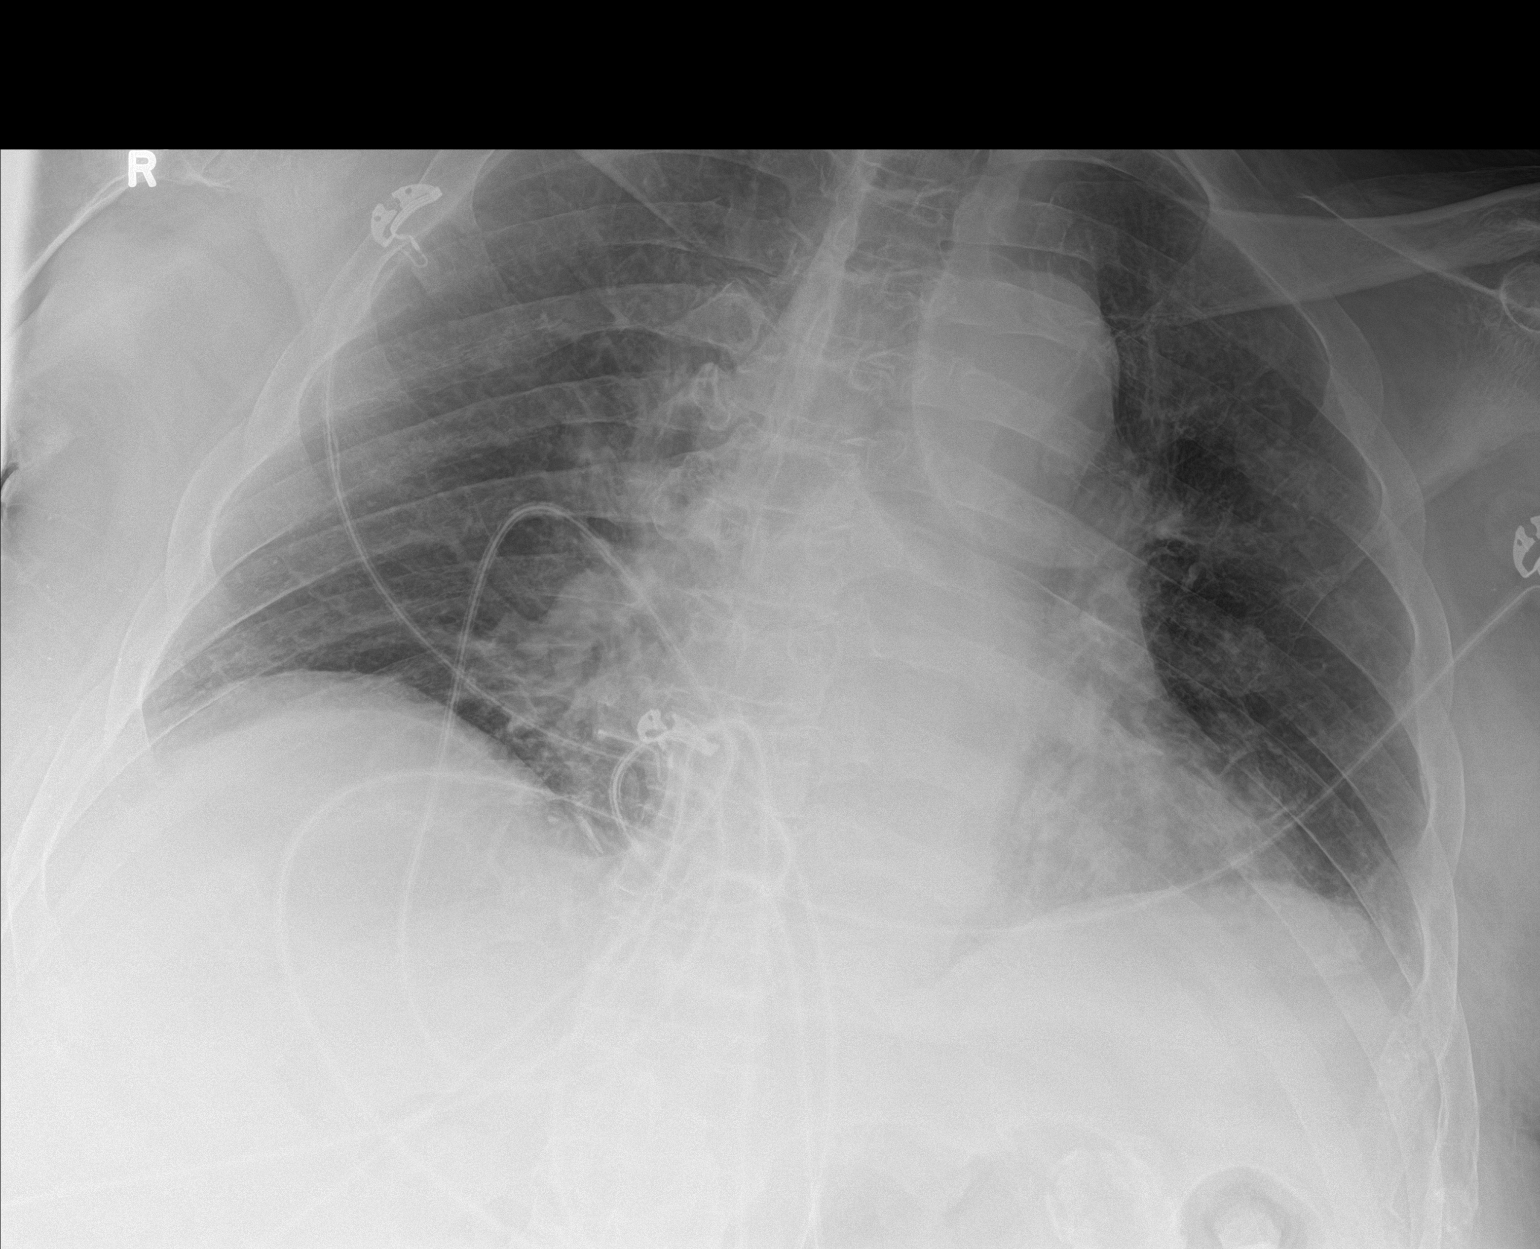

[2 of 2 positions shown; findings below may reference images not displayed]

FINDINGS: There is a shallow inspiration with bibasilar atelectatic changes,
left greater right. There is no focal consolidation, pleural
effusion, or pneumothorax. There is mild cardiomegaly with mild
central vascular prominence which may either mild vascular
congestion. No acute osseous pathology identified.
IMPRESSION: Mild cardiomegaly with possible mild vascular congestion. No focal
consolidation.

## 2018-12-30 DEATH — deceased
